# Patient Record
Sex: Female | Born: 1975 | Race: White | Hispanic: No | Marital: Single | Smoking: Former smoker
Health system: Southern US, Community
[De-identification: ages and names within clinical notes are randomized; demographics above are authoritative.]

## PROBLEM LIST (undated history)

## (undated) DIAGNOSIS — M5030 Other cervical disc degeneration, unspecified cervical region: Secondary | ICD-10-CM

## (undated) DIAGNOSIS — R928 Other abnormal and inconclusive findings on diagnostic imaging of breast: Secondary | ICD-10-CM

## (undated) DIAGNOSIS — K219 Gastro-esophageal reflux disease without esophagitis: Secondary | ICD-10-CM

## (undated) DIAGNOSIS — F419 Anxiety disorder, unspecified: Secondary | ICD-10-CM

## (undated) DIAGNOSIS — B2790 Infectious mononucleosis, unspecified without complication: Secondary | ICD-10-CM

## (undated) DIAGNOSIS — N760 Acute vaginitis: Secondary | ICD-10-CM

## (undated) DIAGNOSIS — R1900 Intra-abdominal and pelvic swelling, mass and lump, unspecified site: Secondary | ICD-10-CM

## (undated) DIAGNOSIS — T753XXA Motion sickness, initial encounter: Secondary | ICD-10-CM

## (undated) DIAGNOSIS — M069 Rheumatoid arthritis, unspecified: Secondary | ICD-10-CM

## (undated) DIAGNOSIS — N921 Excessive and frequent menstruation with irregular cycle: Secondary | ICD-10-CM

## (undated) DIAGNOSIS — B9689 Other specified bacterial agents as the cause of diseases classified elsewhere: Secondary | ICD-10-CM

## (undated) HISTORY — DX: Other specified bacterial agents as the cause of diseases classified elsewhere: B96.89

## (undated) HISTORY — PX: WISDOM TOOTH EXTRACTION: SHX21

## (undated) HISTORY — DX: Intra-abdominal and pelvic swelling, mass and lump, unspecified site: R19.00

## (undated) HISTORY — DX: Infectious mononucleosis, unspecified without complication: B27.90

## (undated) HISTORY — DX: Rheumatoid arthritis, unspecified: M06.9

## (undated) HISTORY — DX: Excessive and frequent menstruation with irregular cycle: N92.1

## (undated) HISTORY — DX: Other abnormal and inconclusive findings on diagnostic imaging of breast: R92.8

## (undated) HISTORY — DX: Other specified bacterial agents as the cause of diseases classified elsewhere: N76.0

---
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
## 2009-05-30 ENCOUNTER — Ambulatory Visit: Payer: Self-pay | Admitting: General Practice

## 2011-09-19 ENCOUNTER — Ambulatory Visit: Payer: Self-pay | Admitting: General Practice

## 2015-06-29 ENCOUNTER — Other Ambulatory Visit: Payer: Self-pay | Admitting: Emergency Medicine

## 2015-06-29 MED ORDER — FLUTICASONE PROPIONATE 50 MCG/ACT NA SUSP: 2.0000 | Freq: Every day | NASAL | Status: DC

## 2015-06-29 NOTE — Telephone Encounter (Signed)
Med refill

## 2015-06-29 NOTE — Telephone Encounter (Signed)
Received a faxed medication request from [facility].  Please advise.  Thank you.

## 2016-01-26 ENCOUNTER — Encounter: Payer: Self-pay | Admitting: Physician Assistant

## 2016-01-26 ENCOUNTER — Ambulatory Visit: Payer: Self-pay | Admitting: Physician Assistant

## 2016-01-26 VITALS — BP 110/65 | HR 69 | Temp 98.3°F

## 2016-01-26 DIAGNOSIS — T148XXA Other injury of unspecified body region, initial encounter: Secondary | ICD-10-CM

## 2016-01-26 MED ORDER — MUPIROCIN 2 % EX OINT: TOPICAL_OINTMENT | CUTANEOUS | 0 refills | Status: DC

## 2016-01-26 NOTE — Progress Notes (Signed)
S: c/o abrasion on top of hand, has some pink raised area around it, tdap utd, no fever/chills/drainage from site; sx for a week, just wants to get it checked  O: vitals wnl, nad, skin on r hand with small scab from abrasion, raised pink area at edges, no drainage, increased warmth, or tenderness, n/v intact  A: abrasion  P: bactroban ointment if redness spreading, if worsening call and will call in oral antibiotic

## 2016-03-25 ENCOUNTER — Other Ambulatory Visit: Payer: Self-pay | Admitting: Physician Assistant

## 2016-03-26 NOTE — Telephone Encounter (Signed)
Med refill approved 

## 2016-08-23 ENCOUNTER — Ambulatory Visit: Payer: Self-pay | Admitting: Physician Assistant

## 2016-08-23 ENCOUNTER — Encounter: Payer: Self-pay | Admitting: Physician Assistant

## 2016-08-23 VITALS — BP 100/60 | HR 71 | Temp 98.3°F

## 2016-08-23 DIAGNOSIS — J069 Acute upper respiratory infection, unspecified: Secondary | ICD-10-CM

## 2016-08-23 MED ORDER — FLUTICASONE PROPIONATE 50 MCG/ACT NA SUSP: 2.0000 | Freq: Every day | NASAL | 6 refills | Status: DC

## 2016-08-23 NOTE — Progress Notes (Signed)
S: C/o sore throat and cough for 3 days, no fever, chills, cp/sob, v/d; cough is dry, feels like its in her chest,   Using otc meds: ibuprofen  O: PE: vitals wnl, nad, perrl eomi, normocephalic, tms dull, nasal mucosa red and swollen, throat injected, neck supple no lymph, lungs c t a, cv rrr, neuro intact  A:  Acute viral uri   P: drink fluids, continue regular meds , use otc meds of choice, return if not improving in 5 days, return earlier if worsening , if worsening over the weekend will call in an antibiotic on Monday

## 2016-09-05 ENCOUNTER — Telehealth: Payer: Self-pay

## 2016-09-05 DIAGNOSIS — N6320 Unspecified lump in the left breast, unspecified quadrant: Secondary | ICD-10-CM

## 2016-09-05 NOTE — Telephone Encounter (Signed)
Pt calling for advice regarding mammogram letter.   Left msg for pt to call back (see mammo report in [place])

## 2016-09-05 NOTE — Telephone Encounter (Signed)
Patient returning Kasey's call regarding lab. [phone] until 4pm

## 2016-09-05 NOTE — Telephone Encounter (Signed)
Left msg for pt to call back

## 2016-09-06 ENCOUNTER — Encounter: Payer: Self-pay | Admitting: Physician Assistant

## 2016-09-06 ENCOUNTER — Ambulatory Visit: Payer: Self-pay | Admitting: Physician Assistant

## 2016-09-06 DIAGNOSIS — R1032 Left lower quadrant pain: Secondary | ICD-10-CM

## 2016-09-06 DIAGNOSIS — K6289 Other specified diseases of anus and rectum: Secondary | ICD-10-CM

## 2016-09-06 NOTE — Telephone Encounter (Signed)
Pt states she was told she needed to have a diagnostic mammogram and possibly ultrasound on left breast due to receiving letter regarding her dense breast tissue. (see [facility] - letter scanned from [place]) Pt would like to be advised if she really needs this additional imaging. Thank you.

## 2016-09-06 NOTE — Telephone Encounter (Signed)
Returning phone call °

## 2016-09-06 NOTE — Progress Notes (Signed)
S: c/o llq pain, states has had diarrhea for about a year but now some of the stools are formed, is now having pain in the rectal area, awakes her at night with a lot of pressure on the rectum, no bleeding, no trauma, [facility] doctor [facility] and had labs done, would like to see someone here in town bc the facility charges were too high at [facility], ?if she needs a colonoscopy, denies pelvic pain, no hx of ovarian cysts  O: vitals wnl, nad, abd soft nontender bs normal all 4 quads, lungs c t a, cv rrr  A: llq pain  P: refer to Dr Allen Norris for eval

## 2016-09-07 DIAGNOSIS — N6320 Unspecified lump in the left breast, unspecified quadrant: Secondary | ICD-10-CM | POA: Insufficient documentation

## 2016-09-07 NOTE — Addendum Note (Signed)
Addended by: Ardeth Perfect B on: 0/10/5911 09:29 PM   Modules accepted: Orders

## 2016-09-07 NOTE — Telephone Encounter (Signed)
Pt states she would rather go to [facility] in [place] rather than [place]. Please update order. Thank you

## 2016-09-07 NOTE — Telephone Encounter (Signed)
Yes ,they saw a small mass in the left breast which will need additional imaging. RN to notify pt. I will put order in computer and route to [facility] since in [place].

## 2016-09-07 NOTE — Telephone Encounter (Signed)
Diagnostic mammo and LT breast u/s orders amended for [facility]. Forward to [place].

## 2016-09-10 ENCOUNTER — Other Ambulatory Visit: Payer: Self-pay | Admitting: Obstetrics and Gynecology

## 2016-09-10 ENCOUNTER — Telehealth: Payer: Self-pay | Admitting: Obstetrics and Gynecology

## 2016-09-10 DIAGNOSIS — N6320 Unspecified lump in the left breast, unspecified quadrant: Secondary | ICD-10-CM

## 2016-09-10 NOTE — Telephone Encounter (Signed)
I spoke to the patient to let her know she needs to go to [facility] to sign a release for her prior imaging, that [facility] needs the images prior to scheduling an appointment, and that Roselyn Reef will contact her directly when the images are received. Patient understands and said she would try to go to [place] this week.

## 2016-09-10 NOTE — Telephone Encounter (Signed)
Per Roselyn Reef @ [facility], the patient needs to go to [facility] to sign a release for her prior imaging, and Roselyn Reef will then contact the patient directly to schedule an appointment. I spoke to the patient, who said she would try to go to [facility] this week.

## 2016-09-11 ENCOUNTER — Telehealth: Payer: Self-pay | Admitting: Gastroenterology

## 2016-09-11 NOTE — Telephone Encounter (Signed)
Spoke to patient to schedule appointment. She was driving and will call me back when she is at work to schedule for LLQ pain with GI

## 2016-09-30 DIAGNOSIS — R928 Other abnormal and inconclusive findings on diagnostic imaging of breast: Secondary | ICD-10-CM

## 2016-09-30 HISTORY — DX: Other abnormal and inconclusive findings on diagnostic imaging of breast: R92.8

## 2016-10-01 ENCOUNTER — Inpatient Hospital Stay
Admission: RE | Admit: 2016-10-01 | Discharge: 2016-10-01 | Disposition: A | Discharge status: Home / Self Care | Payer: Self-pay | Source: Ambulatory Visit | Attending: *Deleted | Admitting: *Deleted

## 2016-10-01 ENCOUNTER — Other Ambulatory Visit: Payer: Self-pay | Admitting: *Deleted

## 2016-10-01 DIAGNOSIS — Z9289 Personal history of other medical treatment: Secondary | ICD-10-CM

## 2016-10-05 ENCOUNTER — Other Ambulatory Visit: Payer: Self-pay | Admitting: Obstetrics and Gynecology

## 2016-10-05 DIAGNOSIS — N6320 Unspecified lump in the left breast, unspecified quadrant: Secondary | ICD-10-CM

## 2016-10-08 ENCOUNTER — Other Ambulatory Visit: Payer: Self-pay | Admitting: Obstetrics and Gynecology

## 2016-10-08 DIAGNOSIS — N63 Unspecified lump in unspecified breast: Secondary | ICD-10-CM

## 2016-10-08 DIAGNOSIS — R928 Other abnormal and inconclusive findings on diagnostic imaging of breast: Secondary | ICD-10-CM

## 2016-10-17 ENCOUNTER — Telehealth: Payer: Self-pay | Admitting: Gastroenterology

## 2016-10-17 ENCOUNTER — Encounter: Payer: Self-pay | Admitting: Gastroenterology

## 2016-10-17 ENCOUNTER — Other Ambulatory Visit: Payer: Self-pay

## 2016-10-17 ENCOUNTER — Ambulatory Visit (INDEPENDENT_AMBULATORY_CARE_PROVIDER_SITE_OTHER): Payer: Managed Care, Other (non HMO) | Admitting: Gastroenterology

## 2016-10-17 ENCOUNTER — Encounter (INDEPENDENT_AMBULATORY_CARE_PROVIDER_SITE_OTHER): Payer: Self-pay

## 2016-10-17 VITALS — BP 102/59 | HR 65 | Temp 98.3°F | Resp 16 | Wt 126.0 lb

## 2016-10-17 DIAGNOSIS — R197 Diarrhea, unspecified: Secondary | ICD-10-CM

## 2016-10-17 DIAGNOSIS — R198 Other specified symptoms and signs involving the digestive system and abdomen: Secondary | ICD-10-CM | POA: Diagnosis not present

## 2016-10-17 DIAGNOSIS — R194 Change in bowel habit: Secondary | ICD-10-CM | POA: Diagnosis not present

## 2016-10-17 NOTE — Telephone Encounter (Signed)
Left vm letting pt know I have moved her colonoscopy from 11/09/16 to 11/30/16 per her request.

## 2016-10-17 NOTE — Telephone Encounter (Signed)
Patient needs to r/s her colonoscopy from 11/09/16 and r/s to Fri 11/30/16, if possible.

## 2016-10-18 NOTE — Progress Notes (Signed)
Gastroenterology Consultation  Referring Provider:     No ref. provider found Primary Care Physician:  Ardeth Perfect, PA-C Primary Gastroenterologist:  Dr. Allen Norris     Reason for Consultation:     Rectal fullness        HPI:   Bethany Evans is a 41 y.o. y/o female referred for consultation & management of Rectal fullness by Dr. Elmo Putt Copland, PA-C.  This patient comes in today for a history of approximately 3 years of loose bowel movements.  The patient states that her bowel movements have become harder depending on what she eats.  The patient states her stools are harder when she eats a high protein diet.  The patient was seen in [facility] for the same issues and reports that she had blood work done at that time with being told everything was normal. Besides the change in bowel habits the patient also reports that she has episodes of rectal fullness.  She denies it to be pain but she feels like there is pressure on her rectum. The patient reports that this usually lasts approximately one half hour.  She denies any spasms or pain at this time.  She also reports that she goes to the bathroom to try to move her bowels with out usually having any results.  The patient denies any rectal bleeding and weight loss fever or chills.  The patient also denies any family history of colon cancer colon polyps.  There is no report of the diarrhea or her symptoms of rectal pressure being changed by her stress level, menstrual cycle or the food she has eaten.  She also reports that she try to avoid milk for one month without any change in her symptoms. The patient reports that her soft bowel movements are only once a day when she wakes up in the morning.  She also reports that sometimes she feels that The need to use the bathroom has woken her up in the early morning.  History reviewed. No pertinent past medical history.  History reviewed. No pertinent surgical history.  Prior to Admission medications   Medication  Sig Start Date End Date Taking? Authorizing Provider  fluticasone (FLONASE) 50 MCG/ACT nasal spray Place 2 sprays into both nostrils daily. 08/23/16  Yes Versie Starks, PA-C  loratadine (CLARITIN) 10 MG tablet TAKE 1 TABLET BY MOUTH DAILY 03/26/16  Yes Versie Starks, PA-C  LORazepam (ATIVAN) 0.5 MG tablet Take 0.5 mg by mouth.   Yes Historical Provider, MD  mupirocin ointment (BACTROBAN) 2 % Apply to wound bid until healed Patient not taking: Reported on 08/23/2016 01/26/16   Versie Starks, PA-C    History reviewed. No pertinent family history.   Social History  Substance Use Topics  . Smoking status: Former Research scientist (life sciences)  . Smokeless tobacco: Never Used  . Alcohol use Not on file    Allergies as of 10/17/2016 - Review Complete 10/17/2016  Allergen Reaction Noted  . Doxycycline Swelling 09/02/2014    Review of Systems:    All systems reviewed and negative except where noted in HPI.   Physical Exam:  BP (!) 102/59   Pulse 65   Temp 98.3 F (36.8 C)   Resp 16   Wt 126 lb (57.2 kg)   SpO2 100%  No LMP recorded. Psych:  Alert and cooperative. Normal mood and affect. General:   Alert,  Well-developed, well-nourished, pleasant and cooperative in NAD Head:  Normocephalic and atraumatic. Eyes:  Sclera clear, no icterus.  Conjunctiva pink. Ears:  Normal auditory acuity. Nose:  No deformity, discharge, or lesions. Mouth:  No deformity or lesions,oropharynx pink & moist. Neck:  Supple; no masses or thyromegaly. Lungs:  Respirations even and unlabored.  Clear throughout to auscultation.   No wheezes, crackles, or rhonchi. No acute distress. Heart:  Regular rate and rhythm; no murmurs, clicks, rubs, or gallops. Abdomen:  Normal bowel sounds.  No bruits.  Soft, non-tender and non-distended without masses, hepatosplenomegaly or hernias noted.  No guarding or rebound tenderness.  Negative Carnett sign.   Rectal:  Deferred.  Msk:  Symmetrical without gross deformities.  Good, equal movement &  strength bilaterally. Pulses:  Normal pulses noted. Extremities:  No clubbing or edema.  No cyanosis. Neurologic:  Alert and oriented x3;  grossly normal neurologically. Skin:  Intact without significant lesions or rashes.  No jaundice. Lymph Nodes:  No significant cervical adenopathy. Psych:  Alert and cooperative. Normal mood and affect.  Imaging Studies: Mm Outside Films Mammo  Result Date: 10/01/2016 This examination belongs to an outside facility and is stored here for comparison purposes only.  Contact the originating outside institution for any associated report or interpretation.   Assessment and Plan:   Bethany Evans is a 41 y.o. y/o female who comes in today with a history of loose bowel movements that typically happen only in the morning. The loose bowel movements have woken her up in the morning from her sleep.  The patient also has had episodes of rectal pressure.  The patient had a workup at [facility] who thought she may have irritable bowel syndrome.  The patient denies any external stressors or her menstrual cycle making the symptoms any worse.  The patient will be set up for a colonoscopy to rule out any colonic pathology as the cause of her loose bowel movements and episodes of rectal pressure. I have discussed risks & benefits which include, but are not limited to, bleeding, infection, perforation & drug reaction.  The patient agrees with this plan & written consent will be obtained.       Lucilla Lame, MD. Marval Regal   Note: This dictation was prepared with Dragon dictation along with smaller phrase technology. Any transcriptional errors that result from this process are unintentional.

## 2016-10-19 ENCOUNTER — Telehealth: Payer: Self-pay | Admitting: Gastroenterology

## 2016-10-19 NOTE — Telephone Encounter (Signed)
10/19/16 Automated system NO prior auth required for Colonoscopy 45378 / R19.7 Confirmation # [id number].

## 2016-10-23 ENCOUNTER — Other Ambulatory Visit: Payer: Self-pay | Admitting: Obstetrics and Gynecology

## 2016-10-23 ENCOUNTER — Ambulatory Visit
Admission: RE | Admit: 2016-10-23 | Discharge: 2016-10-23 | Disposition: A | Discharge status: Home / Self Care | Payer: Managed Care, Other (non HMO) | Source: Ambulatory Visit | Attending: Obstetrics and Gynecology | Admitting: Obstetrics and Gynecology

## 2016-10-23 DIAGNOSIS — N6320 Unspecified lump in the left breast, unspecified quadrant: Secondary | ICD-10-CM

## 2016-10-23 DIAGNOSIS — N63 Unspecified lump in unspecified breast: Secondary | ICD-10-CM

## 2016-10-23 DIAGNOSIS — R928 Other abnormal and inconclusive findings on diagnostic imaging of breast: Secondary | ICD-10-CM

## 2016-10-23 DIAGNOSIS — N6321 Unspecified lump in the left breast, upper outer quadrant: Secondary | ICD-10-CM | POA: Diagnosis present

## 2016-11-22 ENCOUNTER — Encounter: Payer: Self-pay | Admitting: *Deleted

## 2016-11-23 ENCOUNTER — Encounter: Payer: Self-pay | Admitting: Anesthesiology

## 2016-11-29 NOTE — Discharge Instructions (Signed)
General Anesthesia, Adult, Care After °These instructions provide you with information about caring for yourself after your procedure. Your health care provider may also give you more specific instructions. Your treatment has been planned according to current medical practices, but problems sometimes occur. Call your health care provider if you have any problems or questions after your procedure. °What can I expect after the procedure? °After the procedure, it is common to have: °· Vomiting. °· A sore throat. °· Mental slowness. ° °It is common to feel: °· Nauseous. °· Cold or shivery. °· Sleepy. °· Tired. °· Sore or achy, even in parts of your body where you did not have surgery. ° °Follow these instructions at home: °For at least 24 hours after the procedure: °· Do not: °? Participate in activities where you could fall or become injured. °? Drive. °? Use heavy machinery. °? Drink alcohol. °? Take sleeping pills or medicines that cause drowsiness. °? Make important decisions or sign legal documents. °? Take care of children on your own. °· Rest. °Eating and drinking °· If you vomit, drink water, juice, or soup when you can drink without vomiting. °· Drink enough fluid to keep your urine clear or pale yellow. °· Make sure you have little or no nausea before eating solid foods. °· Follow the diet recommended by your health care provider. °General instructions °· Have a responsible adult stay with you until you are awake and alert. °· Return to your normal activities as told by your health care provider. Ask your health care provider what activities are safe for you. °· Take over-the-counter and prescription medicines only as told by your health care provider. °· If you smoke, do not smoke without supervision. °· Keep all follow-up visits as told by your health care provider. This is important. °Contact a health care provider if: °· You continue to have nausea or vomiting at home, and medicines are not helpful. °· You  cannot drink fluids or start eating again. °· You cannot urinate after 8-12 hours. °· You develop a skin rash. °· You have fever. °· You have increasing redness at the site of your procedure. °Get help right away if: °· You have difficulty breathing. °· You have chest pain. °· You have unexpected bleeding. °· You feel that you are having a life-threatening or urgent problem. °This information is not intended to replace advice given to you by your health care provider. Make sure you discuss any questions you have with your health care provider. °Document Released: 09/24/2000 Document Revised: 11/21/2015 Document Reviewed: 06/02/2015 °Elsevier Interactive Patient Education © 2018 Elsevier Inc. ° °

## 2016-11-30 ENCOUNTER — Ambulatory Visit
Admission: RE | Admit: 2016-11-30 | Discharge: 2016-11-30 | Disposition: A | Discharge status: Home / Self Care | Payer: Managed Care, Other (non HMO) | Source: Ambulatory Visit | Attending: Gastroenterology | Admitting: Gastroenterology

## 2016-11-30 ENCOUNTER — Ambulatory Visit: Payer: Managed Care, Other (non HMO) | Admitting: Anesthesiology

## 2016-11-30 ENCOUNTER — Encounter
Admission: RE | Disposition: A | Discharge status: Home / Self Care | Payer: Self-pay | Source: Ambulatory Visit | Attending: Gastroenterology

## 2016-11-30 DIAGNOSIS — K640 First degree hemorrhoids: Secondary | ICD-10-CM | POA: Diagnosis not present

## 2016-11-30 DIAGNOSIS — Z881 Allergy status to other antibiotic agents status: Secondary | ICD-10-CM | POA: Diagnosis not present

## 2016-11-30 DIAGNOSIS — Z87891 Personal history of nicotine dependence: Secondary | ICD-10-CM | POA: Insufficient documentation

## 2016-11-30 DIAGNOSIS — R197 Diarrhea, unspecified: Secondary | ICD-10-CM | POA: Diagnosis not present

## 2016-11-30 HISTORY — DX: Motion sickness, initial encounter: T75.3XXA

## 2016-11-30 HISTORY — DX: Other cervical disc degeneration, unspecified cervical region: M50.30

## 2016-11-30 HISTORY — PX: COLONOSCOPY WITH PROPOFOL: SHX5780

## 2016-11-30 SURGERY — COLONOSCOPY WITH PROPOFOL
Anesthesia: General | Wound class: Contaminated

## 2016-11-30 MED ORDER — PROPOFOL 10 MG/ML IV BOLUS
INTRAVENOUS | Status: DC | PRN
  Administered 2016-11-30 (×4): 50 mg via INTRAVENOUS
  Administered 2016-11-30: 100 mg via INTRAVENOUS
  Administered 2016-11-30: 50 mg via INTRAVENOUS

## 2016-11-30 MED ORDER — ACETAMINOPHEN 325 MG PO TABS: 325.0000 mg | ORAL_TABLET | ORAL | Status: DC | PRN

## 2016-11-30 MED ORDER — LIDOCAINE HCL (CARDIAC) 20 MG/ML IV SOLN
INTRAVENOUS | Status: DC | PRN
  Administered 2016-11-30: 20 mg via INTRAVENOUS

## 2016-11-30 MED ORDER — STERILE WATER FOR IRRIGATION IR SOLN
Status: DC | PRN
  Administered 2016-11-30: 10:00:00

## 2016-11-30 MED ORDER — ACETAMINOPHEN 160 MG/5ML PO SOLN: 325.0000 mg | ORAL | Status: DC | PRN

## 2016-11-30 MED ORDER — LACTATED RINGERS IV SOLN
INTRAVENOUS | Status: DC
  Administered 2016-11-30: 09:00:00 via INTRAVENOUS

## 2016-11-30 SURGICAL SUPPLY — 23 items

## 2016-11-30 NOTE — Anesthesia Procedure Notes (Signed)
Procedure Name: MAC Date/Time: 11/30/2016 10:11 AM Performed by: Janna Arch Pre-anesthesia Checklist: Patient identified, Emergency Drugs available, Suction available and Patient being monitored Patient Re-evaluated:Patient Re-evaluated prior to inductionOxygen Delivery Method: Nasal cannula

## 2016-11-30 NOTE — Anesthesia Preprocedure Evaluation (Signed)
Anesthesia Evaluation  Patient identified by MRN, date of birth, ID band Patient awake    Reviewed: Allergy & Precautions, H&P , NPO status , Patient's Chart, lab work & pertinent test results  Airway Mallampati: II  TM Distance: >3 FB Neck ROM: full    Dental no notable dental hx.    Pulmonary former smoker,    Pulmonary exam normal        Cardiovascular Normal cardiovascular exam     Neuro/Psych    GI/Hepatic   Endo/Other    Renal/GU      Musculoskeletal   Abdominal   Peds  Hematology   Anesthesia Other Findings   Reproductive/Obstetrics                             Anesthesia Physical Anesthesia Plan  ASA: I  Anesthesia Plan: General   Post-op Pain Management:    Induction:   Airway Management Planned:   Additional Equipment:   Intra-op Plan:   Post-operative Plan:   Informed Consent: I have reviewed the patients History and Physical, chart, labs and discussed the procedure including the risks, benefits and alternatives for the proposed anesthesia with the patient or authorized representative who has indicated his/her understanding and acceptance.     Plan Discussed with:   Anesthesia Plan Comments:         Anesthesia Quick Evaluation

## 2016-11-30 NOTE — Anesthesia Postprocedure Evaluation (Signed)
Anesthesia Post Note  Patient: Bethany Evans  Procedure(s) Performed: Procedure(s) (LRB): COLONOSCOPY WITH PROPOFOL (N/A)  Patient location during evaluation: PACU Anesthesia Type: General Level of consciousness: awake and alert and oriented Pain management: satisfactory to patient Vital Signs Assessment: post-procedure vital signs reviewed and stable Respiratory status: spontaneous breathing, nonlabored ventilation and respiratory function stable Cardiovascular status: blood pressure returned to baseline and stable Postop Assessment: Adequate PO intake and No signs of nausea or vomiting Anesthetic complications: no    Raliegh Ip

## 2016-11-30 NOTE — Transfer of Care (Signed)
Immediate Anesthesia Transfer of Care Note  Patient: Bethany Evans  Procedure(s) Performed: Procedure(s): COLONOSCOPY WITH PROPOFOL (N/A)  Patient Location: PACU  Anesthesia Type: General  Level of Consciousness: awake, alert  and patient cooperative  Airway and Oxygen Therapy: Patient Spontanous Breathing and Patient connected to supplemental oxygen  Post-op Assessment: Post-op Vital signs reviewed, Patient's Cardiovascular Status Stable, Respiratory Function Stable, Patent Airway and No signs of Nausea or vomiting  Post-op Vital Signs: Reviewed and stable  Complications: No apparent anesthesia complications

## 2016-11-30 NOTE — Op Note (Signed)
[facility] Gastroenterology Patient Name: Bethany Evans Procedure Date: 11/30/2016 10:02 AM MRN: [id number] Account #: [id number] Date of Birth: [birth year] Admit Type: Outpatient Age: 41 Room: [facility] OR ROOM 01 Gender: Female Note Status: Finalized Procedure:            Colonoscopy Indications:          Clinically significant diarrhea of unexplained origin Providers:            Lucilla Lame MD, MD Referring MD:         Stoney Bang. Staebler (Referring MD) Medicines:            Propofol per Anesthesia Complications:        No immediate complications. Procedure:            Pre-Anesthesia Assessment:                       - Prior to the procedure, a History and Physical was                        performed, and patient medications and allergies were                        reviewed. The patient's tolerance of previous                        anesthesia was also reviewed. The risks and benefits of                        the procedure and the sedation options and risks were                        discussed with the patient. All questions were                        answered, and informed consent was obtained. Prior                        Anticoagulants: The patient has taken no previous                        anticoagulant or antiplatelet agents. ASA Grade                        Assessment: II - A patient with mild systemic disease.                        After reviewing the risks and benefits, the patient was                        deemed in satisfactory condition to undergo the                        procedure.                       After obtaining informed consent, the colonoscope was                        passed under direct vision. Throughout the procedure,  the patient's blood pressure, pulse, and oxygen                        saturations were monitored continuously. The [place] (S#: [id number]) was  introduced through                        the anus and advanced to the the terminal ileum. The                        colonoscopy was performed without difficulty. The                        patient tolerated the procedure well. The quality of                        the bowel preparation was excellent. Findings:      The perianal and digital rectal examinations were normal.      Non-bleeding internal hemorrhoids were found during retroflexion. The       hemorrhoids were Grade I (internal hemorrhoids that do not prolapse).      Random biopsies were obtained with cold forceps for histology randomly       in the entire colon. Impression:           - Non-bleeding internal hemorrhoids.                       - Random biopsies were obtained in the entire colon. Recommendation:       - Discharge patient to home.                       - Resume previous diet.                       - Continue present medications. Procedure Code(s):    --- Professional ---                       [phone], Colonoscopy, flexible; with biopsy, single or                        multiple Diagnosis Code(s):    --- Professional ---                       R19.7, Diarrhea, unspecified CPT copyright 2016 American Medical Association. All rights reserved. The codes documented in this report are preliminary and upon coder review may  be revised to meet current compliance requirements. Lucilla Lame MD, MD 11/30/2016 10:31:14 AM This report has been signed electronically. Number of Addenda: 0 Note Initiated On: 11/30/2016 10:02 AM Scope Withdrawal Time: 0 hours 5 minutes 47 seconds  Total Procedure Duration: 0 hours 12 minutes 21 seconds       [facility]

## 2016-11-30 NOTE — H&P (Signed)
   Lucilla Lame, MD [address] [place],  [address] Phone:[phone] Fax : [phone]  Primary Care Physician:  Amalia Greenhouse Primary Gastroenterologist:  Dr. Allen Norris  Pre-Procedure History & Physical: HPI:  Bethany Evans is a 41 y.o. female is here for an colonoscopy.   Past Medical History:  Diagnosis Date  . Degenerative disc disease, cervical    no issues currently  . Motion sickness    boats    Past Surgical History:  Procedure Laterality Date  . WISDOM TOOTH EXTRACTION     all 4 were impacted    Prior to Admission medications   Medication Sig Start Date End Date Taking? Authorizing Provider  fluticasone (FLONASE) 50 MCG/ACT nasal spray Place 2 sprays into both nostrils daily. 08/23/16  Yes Fisher, Linden Dolin, PA-C  LORazepam (ATIVAN) 0.5 MG tablet Take 0.5 mg by mouth.   Yes [provider]  loratadine (CLARITIN) 10 MG tablet TAKE 1 TABLET BY MOUTH DAILY Patient not taking: Reported on 11/22/2016 03/26/16   Versie Starks, PA-C  mupirocin ointment Drue Stager) 2 % Apply to wound bid until healed Patient not taking: Reported on 08/23/2016 01/26/16   Versie Starks, PA-C    Allergies as of 10/17/2016 - Review Complete 10/17/2016  Allergen Reaction Noted  . Doxycycline Swelling 09/02/2014    Family History  Problem Relation Age of Onset  . Breast cancer Neg Hx     Social History   Social History  . Marital status: Single    Spouse name: N/A  . Number of children: N/A  . Years of education: N/A   Occupational History  . Not on file.   Social History Main Topics  . Smoking status: Former Smoker    Quit date: 2015  . Smokeless tobacco: Never Used  . Alcohol use 1.2 oz/week    1 Glasses of wine, 1 Shots of liquor per week  . Drug use: Unknown  . Sexual activity: Not on file   Other Topics Concern  . Not on file   Social History Narrative  . No narrative on file    Review of Systems: See HPI, otherwise  negative ROS  Physical Exam: BP (!) 99/44   Pulse 65   Temp 97.5 F (36.4 C) (Tympanic)   Resp 14   Ht 5' 4.5" (1.638 m)   Wt 124 lb (56.2 kg)   LMP 11/10/2016 (Exact Date) Comment: Preg Test Negative  SpO2 100%   BMI 20.96 kg/m  General:   Alert,  pleasant and cooperative in NAD Head:  Normocephalic and atraumatic. Neck:  Supple; no masses or thyromegaly. Lungs:  Clear throughout to auscultation.    Heart:  Regular rate and rhythm. Abdomen:  Soft, nontender and nondistended. Normal bowel sounds, without guarding, and without rebound.   Neurologic:  Alert and  oriented x4;  grossly normal neurologically.  Impression/Plan: [name] is here for an colonoscopy to be performed for diarrhea  Risks, benefits, limitations, and alternatives regarding  colonoscopy have been reviewed with the patient.  Questions have been answered.  All parties agreeable.   Lucilla Lame, MD  11/30/2016, 9:29 AM

## 2016-12-03 ENCOUNTER — Encounter: Payer: Self-pay | Admitting: Gastroenterology

## 2016-12-05 ENCOUNTER — Encounter: Payer: Self-pay | Admitting: Gastroenterology

## 2016-12-06 ENCOUNTER — Encounter: Payer: Self-pay | Admitting: Gastroenterology

## 2016-12-09 ENCOUNTER — Encounter: Payer: Self-pay | Admitting: Obstetrics and Gynecology

## 2017-01-24 ENCOUNTER — Other Ambulatory Visit: Payer: Self-pay

## 2017-01-24 ENCOUNTER — Ambulatory Visit
Admission: RE | Admit: 2017-01-24 | Discharge: 2017-01-24 | Disposition: A | Discharge status: Home / Self Care | Payer: Managed Care, Other (non HMO) | Source: Ambulatory Visit | Attending: Obstetrics and Gynecology | Admitting: Obstetrics and Gynecology

## 2017-01-24 ENCOUNTER — Ambulatory Visit: Payer: Managed Care, Other (non HMO)

## 2017-01-24 DIAGNOSIS — N6321 Unspecified lump in the left breast, upper outer quadrant: Secondary | ICD-10-CM | POA: Diagnosis not present

## 2017-01-24 DIAGNOSIS — N6320 Unspecified lump in the left breast, unspecified quadrant: Secondary | ICD-10-CM | POA: Diagnosis present

## 2017-01-28 ENCOUNTER — Telehealth: Payer: Self-pay | Admitting: Obstetrics and Gynecology

## 2017-01-28 NOTE — Telephone Encounter (Signed)
LM for pt re: cat 3 f/u mammo and u/s LT breast, stable area. Radiology suggested possible bx. I recommend seeing gen surg for 2nd opinion first. Asked pt to call me back.

## 2017-03-04 ENCOUNTER — Other Ambulatory Visit: Payer: Self-pay | Admitting: Obstetrics and Gynecology

## 2017-03-05 ENCOUNTER — Other Ambulatory Visit: Payer: Self-pay

## 2017-03-08 ENCOUNTER — Other Ambulatory Visit: Payer: Self-pay | Admitting: Obstetrics and Gynecology

## 2017-03-08 NOTE — Telephone Encounter (Signed)
Please advise for refill. Thank you.  

## 2017-07-02 DIAGNOSIS — R1900 Intra-abdominal and pelvic swelling, mass and lump, unspecified site: Secondary | ICD-10-CM

## 2017-07-02 HISTORY — DX: Intra-abdominal and pelvic swelling, mass and lump, unspecified site: R19.00

## 2017-07-18 ENCOUNTER — Encounter: Payer: Self-pay | Admitting: Obstetrics and Gynecology

## 2017-07-18 ENCOUNTER — Ambulatory Visit (INDEPENDENT_AMBULATORY_CARE_PROVIDER_SITE_OTHER): Payer: Managed Care, Other (non HMO) | Admitting: Obstetrics and Gynecology

## 2017-07-18 ENCOUNTER — Other Ambulatory Visit: Payer: Self-pay

## 2017-07-18 VITALS — BP 100/60 | HR 84 | Ht 64.0 in | Wt 126.0 lb

## 2017-07-18 DIAGNOSIS — Z01419 Encounter for gynecological examination (general) (routine) without abnormal findings: Secondary | ICD-10-CM | POA: Diagnosis not present

## 2017-07-18 DIAGNOSIS — Z124 Encounter for screening for malignant neoplasm of cervix: Secondary | ICD-10-CM

## 2017-07-18 DIAGNOSIS — F419 Anxiety disorder, unspecified: Secondary | ICD-10-CM | POA: Diagnosis not present

## 2017-07-18 DIAGNOSIS — Z1151 Encounter for screening for human papillomavirus (HPV): Secondary | ICD-10-CM | POA: Diagnosis not present

## 2017-07-18 DIAGNOSIS — R1900 Intra-abdominal and pelvic swelling, mass and lump, unspecified site: Secondary | ICD-10-CM

## 2017-07-18 DIAGNOSIS — Z1239 Encounter for other screening for malignant neoplasm of breast: Secondary | ICD-10-CM

## 2017-07-18 DIAGNOSIS — Z1231 Encounter for screening mammogram for malignant neoplasm of breast: Secondary | ICD-10-CM | POA: Diagnosis not present

## 2017-07-18 DIAGNOSIS — R928 Other abnormal and inconclusive findings on diagnostic imaging of breast: Secondary | ICD-10-CM

## 2017-07-18 MED ORDER — LORAZEPAM 0.5 MG PO TABS: ORAL_TABLET | ORAL | 0 refills | Status: DC

## 2017-07-18 NOTE — Progress Notes (Signed)
PCP:  Chad Cordial, PA-C   Chief Complaint  Patient presents with  . Gynecologic Exam    No Complaints     HPI:      [name] is a 42 y.o. No obstetric history on file. who LMP was Patient's last menstrual period was 07/13/2017., presents today for her annual examination.  Her menses are regular every 28-30 days, lasting 7 days.  Dysmenorrhea mild, occurring first 1-2 days of flow. She does not have intermenstrual bleeding. Hx of 13 mm ant leio and 16 and 18 mm post leios on 2011 u/s.   Sex activity: has sex with females.  Last Pap: May 20, 2014  Results were: no abnormalities /neg HPV DNA  Hx of STDs: none  Last mammogram: 08/27/16 [place] Needed addl views LT breast; 10/23/16 cat 3, recommended 3 mo f/u; 01/24/17 mammo and u/s were stable; 6 mo f/u vs bx. Pt never did bx. She is concerned about yearly dx mammo this yr because dx mammos were so expensive last yr. There is no FH of breast cancer. There is no FH of ovarian cancer. The patient does not do self-breast exams.   Tobacco use: The patient denies current or previous tobacco use. Alcohol use: none No drug use.  Exercise: very active  She does get adequate calcium but  not Vitamin D in her diet.  She takes ativan sparingly prn anxiety and needs a RF.    Past Medical History:  Diagnosis Date  . Bacterial vaginosis   . Degenerative disc disease, cervical    no issues currently  . [place] Patient infection   . Menometrorrhagia   . Motion sickness    boats    Past Surgical History:  Procedure Laterality Date  . COLONOSCOPY WITH PROPOFOL N/A 11/30/2016   Procedure: COLONOSCOPY WITH PROPOFOL;  Surgeon: Lucilla Lame, MD;  Location: [place];  Service: Endoscopy;  Laterality: N/A; repeat 10 yrs  . HEAD & NECK SKIN LESION EXCISIONAL BIOPSY Left 1989   Face Benign  . WISDOM TOOTH EXTRACTION     all 4 were impacted    Family History  Problem Relation Age of Onset  . Hyperlipidemia Father    . Melanoma Maternal Grandmother   . Pancreatic cancer Maternal Grandmother 60  . Melanoma Mother 90  . Breast cancer Neg Hx     Social History   Socioeconomic History  . Marital status: Single    Spouse name: Not on file  . Number of children: Not on file  . Years of education: Not on file  . Highest education level: Not on file  Social Needs  . Financial resource strain: Not on file  . Food insecurity - worry: Not on file  . Food insecurity - inability: Not on file  . Transportation needs - medical: Not on file  . Transportation needs - non-medical: Not on file  Occupational History  . Not on file  Tobacco Use  . Smoking status: Former Smoker    Last attempt to quit: 2015    Years since quitting: 4.0  . Smokeless tobacco: Never Used  Substance and Sexual Activity  . Alcohol use: Yes    Alcohol/week: 1.2 oz    Types: 1 Glasses of wine, 1 Shots of liquor per week    Comment: Occasionally  . Drug use: No  . Sexual activity: Yes    Birth control/protection: None  Other Topics Concern  . Not on file  Social History Narrative  .  Not on file    Current Meds  Medication Sig  . loratadine (CLARITIN) 10 MG tablet TAKE 1 TABLET BY MOUTH DAILY  . LORazepam (ATIVAN) 0.5 MG tablet TAKE 1 TABLET BY MOUTH AS NEEDED SYMPTOMS  . [DISCONTINUED] LORazepam (ATIVAN) 0.5 MG tablet TAKE 1 TABLET BY MOUTH AS NEEDED SYMPTOMS     ROS:  Review of Systems  Constitutional: Negative for fatigue, fever and unexpected weight change.  Respiratory: Negative for cough, shortness of breath and wheezing.   Cardiovascular: Negative for chest pain, palpitations and leg swelling.  Gastrointestinal: Negative for blood in stool, constipation, diarrhea, nausea and vomiting.  Endocrine: Negative for cold intolerance, heat intolerance and polyuria.  Genitourinary: Negative for dyspareunia, dysuria, flank pain, frequency, genital sores, hematuria, menstrual problem, pelvic pain, urgency, vaginal  bleeding, vaginal discharge and vaginal pain.  Musculoskeletal: Negative for back pain, joint swelling and myalgias.  Skin: Negative for rash.  Neurological: Negative for dizziness, syncope, light-headedness, numbness and headaches.  Hematological: Negative for adenopathy.  Psychiatric/Behavioral: Positive for agitation. Negative for confusion, sleep disturbance and suicidal ideas. The patient is not nervous/anxious.      Objective: BP 100/60 (BP Location: Left Arm, Patient Position: Sitting, Cuff Size: Normal)   Pulse 84   Ht 5\' 4"  (1.626 m)   Wt 126 lb (57.2 kg)   LMP 07/13/2017   BMI 21.63 kg/m    Physical Exam  Constitutional: She is oriented to person, place, and time. She appears well-developed and well-nourished.  Genitourinary: Vagina normal. There is no rash or tenderness on the right labia. There is no rash or tenderness on the left labia. No erythema or tenderness in the vagina. No vaginal discharge found. Right adnexum does not display mass and does not display tenderness. Left adnexum does not display mass and does not display tenderness. Cervix does not exhibit motion tenderness or polyp.   Uterus is enlarged and exhibiting a mass. Uterus is not tender.  Neck: Normal range of motion. No thyromegaly present.  Cardiovascular: Normal rate, regular rhythm and normal heart sounds.  No murmur heard. Pulmonary/Chest: Effort normal and breath sounds normal. Right breast exhibits no mass, no nipple discharge, no skin change and no tenderness. Left breast exhibits no mass, no nipple discharge, no skin change and no tenderness.  Abdominal: Soft. She exhibits mass. There is no tenderness. There is no guarding.    ~4 CM FIRM PELVIC MASS ABOVE PUBIC BONE; SLIGHTLY UNCOMFORTABLE WITH PALPATION  Musculoskeletal: Normal range of motion.  Neurological: She is alert and oriented to person, place, and time. No cranial nerve deficit.  Psychiatric: She has a normal mood and affect. Her  behavior is normal.  Vitals reviewed.   Assessment/Plan: Encounter for annual routine gynecological examination  Cervical cancer screening - Plan: IGP, Aptima HPV  Screening for HPV (human papillomavirus) - Plan: IGP, Aptima HPV  Screening for breast cancer - Pt to sched mammo/u/s. - Plan: MM DIAG BREAST TOMO BILATERAL, US BREAST LTD UNI LEFT INC AXILLA  Abnormal mammogram - Plan: MM DIAG BREAST TOMO BILATERAL, US BREAST LTD UNI LEFT INC AXILLA  Pelvic mass - Felt on abd and pelvic exam. Question bladder, MSK, vs uterine. Check GYN u/s. Will f/u with results. Hx of small leio on 2011 u/s.  - Plan: US PELVIS TRANSVANGINAL NON-OB (TV ONLY)  Anxiety - Rx RF ativan. Pt takes very sparingly. - Plan: LORazepam (ATIVAN) 0.5 MG tablet  Meds ordered this encounter  Medications  . LORazepam (ATIVAN) 0.5 MG tablet  Sig: TAKE 1 TABLET BY MOUTH AS NEEDED SYMPTOMS    Dispense:  30 tablet    Refill:  0    This request is for a new prescription for a controlled substance as required by Federal/State law.             GYN counsel breast self exam, mammography screening, adequate intake of calcium and vitamin D, diet and exercise     F/U  Return in about 3 days (around 07/21/2017) for GYN u/s--[facility] to call pt after.  Alicia B. Copland, PA-C 07/18/2017 9:45 AM

## 2017-07-18 NOTE — Patient Instructions (Signed)
I value your feedback and entrusting us with your care. If you get a [facility] patient survey, I would appreciate you taking the time to let us know about your experience today. Thank you! 

## 2017-07-20 LAB — IGP, APTIMA HPV
HPV APTIMA: NEGATIVE
PAP Smear Comment: 0

## 2017-08-01 ENCOUNTER — Other Ambulatory Visit: Payer: Self-pay | Admitting: Obstetrics and Gynecology

## 2017-08-01 ENCOUNTER — Ambulatory Visit (INDEPENDENT_AMBULATORY_CARE_PROVIDER_SITE_OTHER): Payer: Managed Care, Other (non HMO)

## 2017-08-01 DIAGNOSIS — R1900 Intra-abdominal and pelvic swelling, mass and lump, unspecified site: Secondary | ICD-10-CM

## 2017-08-02 ENCOUNTER — Encounter: Payer: Self-pay | Admitting: Obstetrics and Gynecology

## 2017-08-05 ENCOUNTER — Telehealth: Payer: Self-pay | Admitting: Obstetrics and Gynecology

## 2017-08-05 ENCOUNTER — Encounter: Payer: Self-pay | Admitting: Obstetrics and Gynecology

## 2017-08-05 NOTE — Telephone Encounter (Signed)
Spoke with pt re: large and mult leio on u/s, palpable on abd and pelvic exams. Pt doesn't have menometrorrhagia/pelvic pain sx, but has had urinary frequency/urgency sx and GI issues for many months. Saw GI for rectal fullness, discomfort, bowel issues and had neg eval/colonoscopy. Pt wonders if urin and GI sx related to leio. Given size of them, could be pushing on both bladder and intestines.  Discussed hyst vs AUE (not sure if a candidate). Pt would like to think about it and will f/u with Dr. Kenton Kingfisher if desires to proceed with surg.

## 2017-08-26 ENCOUNTER — Other Ambulatory Visit: Payer: Self-pay

## 2017-08-27 ENCOUNTER — Encounter: Payer: Self-pay | Admitting: Obstetrics & Gynecology

## 2017-08-27 ENCOUNTER — Telehealth: Payer: Self-pay | Admitting: Obstetrics & Gynecology

## 2017-08-27 ENCOUNTER — Ambulatory Visit (INDEPENDENT_AMBULATORY_CARE_PROVIDER_SITE_OTHER): Payer: Managed Care, Other (non HMO) | Admitting: Obstetrics & Gynecology

## 2017-08-27 VITALS — BP 100/60 | HR 84 | Ht 64.0 in | Wt 127.0 lb

## 2017-08-27 DIAGNOSIS — D251 Intramural leiomyoma of uterus: Secondary | ICD-10-CM | POA: Diagnosis not present

## 2017-08-27 DIAGNOSIS — D250 Submucous leiomyoma of uterus: Secondary | ICD-10-CM | POA: Diagnosis not present

## 2017-08-27 NOTE — Progress Notes (Signed)
  HPI: Pt presents with pelvic pressure and urinary frequency/urgency; also feels she has GI side effects to fibroids (known for years, only recently worsening sx's). Her menses are regular every 28-30 days, lasting 7 days.  Dysmenorrhea mild, occurring first 1-2 days of flow. She does not have intermenstrual bleeding.  Recent Ultrasound demonstrates: Indications:pelvic mass Findings:  The uterus measures 14.33 x 9.61 x 11.04cm (transabdominally). Echo texture is heterogenous with evidence of focal masses. Within the uterus are multiple suspected fibroids measuring: Fibroid 1: 3.22 x 4.03 cm; Left posterior fundal, intramural Fibroid 2: 7.45 x 8.85 cm; Posterior fundal; intramural Fibroid 3: .79 x 1.27 cm; Right lateral fundal; intramural Fibroid 4: 1.69 x 1.95 cm; Rt lateral fundal, intramural Fibroid 5: 1.86 x 1.11 cm; Posterior LUS, subserosal The Endometrium measures 4.37 mm. Right Ovary is not seen due to enlarged fibroid uterus Left Ovary measures 2.44 x 2.28 x 1.17 cm. It is normal appearance. (seen transabdominally) Survey of the adnexa demonstrates no adnexal masses. There is no free fluid in the cul de sac.  PMHx: She  has a past medical history of Abnormal mammogram of left breast (09/2016), Bacterial vaginosis, Degenerative disc disease, cervical, Epstein Barr infection, Menometrorrhagia, Motion sickness, and Pelvic mass (07/2017). Also,  has a past surgical history that includes Wisdom tooth extraction; Colonoscopy with propofol (N/A, 11/30/2016); and Head & neck skin lesion excisional biopsy (Left, 1989)., family history includes Hyperlipidemia in her father; Melanoma in her maternal grandmother; Melanoma (age of onset: 11) in her mother; Pancreatic cancer (age of onset: 37) in her maternal grandmother.,  reports that she quit smoking about 4 years ago. she has never used smokeless tobacco. She reports that she drinks about 1.2 oz of alcohol per week. She reports that she does not  use drugs.  She has a current medication list which includes the following prescription(s): fluticasone, loratadine, lorazepam, and mupirocin ointment. Also, is allergic to doxycycline.  Review of Systems  All other systems reviewed and are negative.  Objective: BP 100/60   Pulse 84   Ht 5\' 4"  (1.626 m)   Wt 127 lb (57.6 kg)   LMP 07/29/2017   BMI 21.80 kg/m   Physical examination Constitutional NAD, Conversant  Skin No rashes, lesions or ulceration.   Extremities: Moves all appropriately.  Normal ROM for age. No lymphadenopathy.  Neuro: Grossly intact  Psych: Oriented to PPT.  Normal mood. Normal affect.   Assessment:  Intramural and submucous leiomyoma of uterus Options discussed, desires TLH BS.  Myomectomy, [place], Lupron, exp mgt also disucssed.  Info provided to pt.  Barnett Applebaum, MD, [facility], [facility] 08/27/2017  8:26 AM

## 2017-08-27 NOTE — Telephone Encounter (Signed)
Lmtrc

## 2017-08-27 NOTE — Telephone Encounter (Signed)
Surgery Booking Request Patient Full Name:  Bethany Evans  MRN: [id number]  DOB: 02-03-1976  Surgeon: Hoyt Koch, MD  Requested Surgery Date and Time: 09/26/17 Primary Diagnosis AND Code: Fibroid Uterus, Pelvic Pressure, Urinary frequency and urgency Secondary Diagnosis and Code:  Surgical Procedure: TLH, BS, Cysto, possible laparotomy L&D Notification: No Admission Status: same day surgery Length of Surgery: 1.5 hrs Special Case Needs: possible laparotomy, possible morcellator H&P:  yes (date) Phone Interview???: yes Interpreter: Language:  Medical Clearance: no Special Scheduling Instructions: no

## 2017-08-27 NOTE — Patient Instructions (Signed)
Total Laparoscopic Hysterectomy °A total laparoscopic hysterectomy is a minimally invasive surgery to remove your uterus and cervix. This surgery is performed by making several small cuts (incisions) in your abdomen. It can also be done with a thin, lighted tube (laparoscope) inserted into two small incisions in your lower abdomen. Your fallopian tubes and ovaries can be removed (bilateral salpingo-oophorectomy) during this surgery as well. Benefits of minimally invasive surgery include: °· Less pain. °· Less risk of blood loss. °· Less risk of infection. °· Quicker return to normal activities. ° °Tell a health care provider about: °· Any allergies you have. °· All medicines you are taking, including vitamins, herbs, eye drops, creams, and over-the-counter medicines. °· Any problems you or family members have had with anesthetic medicines. °· Any blood disorders you have. °· Any surgeries you have had. °· Any medical conditions you have. °What are the risks? °Generally, this is a safe procedure. However, as with any procedure, complications can occur. Possible complications include: °· Bleeding. °· Blood clots in the legs or lung. °· Infection. °· Injury to surrounding organs. °· Problems with anesthesia. °· Early menopause symptoms (hot flashes, night sweats, insomnia). °· Risk of conversion to an open abdominal incision. ° °What happens before the procedure? °· Ask your health care provider about changing or stopping your regular medicines. °· Do not take aspirin or blood thinners (anticoagulants) for 1 week before the surgery or as told by your health care provider. °· Do not eat or drink anything for 8 hours before the surgery or as told by your health care provider. °· Quit smoking if you smoke. °· Arrange for a ride home after surgery and for someone to help you at home during recovery. °What happens during the procedure? °· You will be given antibiotic medicine. °· An IV tube will be placed in your arm. You  will be given medicine to make you sleep (general anesthetic). °· A gas (carbon dioxide) will be used to inflate your abdomen. This will allow your surgeon to look inside your abdomen, perform your surgery, and treat any other problems found if necessary. °· Three or four small incisions (often less than 1/2 inch) will be made in your abdomen. One of these incisions will be made in the area of your belly button (navel). The laparoscope will be inserted into the incision. Your surgeon will look through the laparoscope while doing your procedure. °· Other surgical instruments will be inserted through the other incisions. °· Your uterus may be removed through your vagina or cut into small pieces and removed through the small incisions. °· Your incisions will be closed. °What happens after the procedure? °· The gas will be released from inside your abdomen. °· You will be taken to the recovery area where a nurse will watch and check your progress. Once you are awake, stable, and taking fluids well, without other problems, you will return to your room or be allowed to go home. °· There is usually minimal discomfort following the surgery because the incisions are so small. °· You will be given pain medicine while you are in the hospital and for when you go home. °This information is not intended to replace advice given to you by your health care provider. Make sure you discuss any questions you have with your health care provider. °Document Released: 04/15/2007 Document Revised: 11/24/2015 Document Reviewed: 01/06/2013 °Elsevier Interactive Patient Education © 2017 [employer] Inc[employer] ° °

## 2017-08-28 NOTE — Telephone Encounter (Signed)
Lmtrc

## 2017-08-28 NOTE — Telephone Encounter (Signed)
Patient returned the call and is aware of H&p at [facility] on 09/19/17 @ 8:20am, Pre-admit Testing phone interview to be scheduled, and OR on 09/26/17. Patient is aware to turn in fmla paperwork as soon as she is able.Patient is aware to expect phone calls from the [facility] and [facility].

## 2017-09-16 ENCOUNTER — Telehealth: Payer: Self-pay

## 2017-09-16 NOTE — Telephone Encounter (Signed)
FMLA/DISABILITY form for [facility] filled out, signature obtained and given to [place] for procesing.

## 2017-09-19 ENCOUNTER — Ambulatory Visit (INDEPENDENT_AMBULATORY_CARE_PROVIDER_SITE_OTHER): Payer: Managed Care, Other (non HMO) | Admitting: Obstetrics & Gynecology

## 2017-09-19 ENCOUNTER — Other Ambulatory Visit: Payer: Self-pay

## 2017-09-19 ENCOUNTER — Encounter
Admission: RE | Admit: 2017-09-19 | Discharge: 2017-09-19 | Disposition: A | Discharge status: Home / Self Care | Payer: Managed Care, Other (non HMO) | Source: Ambulatory Visit | Attending: Obstetrics & Gynecology | Admitting: Obstetrics & Gynecology

## 2017-09-19 ENCOUNTER — Encounter: Payer: Self-pay | Admitting: *Deleted

## 2017-09-19 ENCOUNTER — Encounter: Payer: Self-pay | Admitting: Obstetrics & Gynecology

## 2017-09-19 VITALS — BP 110/60 | HR 89 | Ht 64.0 in | Wt 125.0 lb

## 2017-09-19 DIAGNOSIS — R350 Frequency of micturition: Secondary | ICD-10-CM

## 2017-09-19 DIAGNOSIS — R102 Pelvic and perineal pain: Secondary | ICD-10-CM

## 2017-09-19 DIAGNOSIS — D251 Intramural leiomyoma of uterus: Secondary | ICD-10-CM

## 2017-09-19 DIAGNOSIS — D250 Submucous leiomyoma of uterus: Secondary | ICD-10-CM | POA: Diagnosis not present

## 2017-09-19 NOTE — Progress Notes (Signed)
PRE-OPERATIVE HISTORY AND PHYSICAL EXAM  HPI:  Bethany Evans is a 42 y.o. G1P1001 No LMP recorded.; she is being admitted for surgery related to fibroids.  Pt presents with pelvic pressure and urinary frequency/urgency; also feels she has GI side effects to fibroids (known for years, only recently worsening sx's). Her menses are regular every 28-30 days, lasting7days. Dysmenorrhea mild, occurringfirst 1-2 days of flow. [place] nothave intermenstrual bleeding.  Recent Ultrasound demonstrates: Indications:pelvic mass Findings: The uterus measures 14.33 x 9.61 x 11.04cm (transabdominally). Echo texture is heterogenouswithevidence of focal masses. Within the uterus are multiple suspected fibroids measuring: Fibroid 1: 3.22 x 4.03 cm; Left posterior fundal, intramural Fibroid 2: 7.45 x 8.85 cm; Posterior fundal; intramural Fibroid 3: .79 x 1.27 cm; Right lateral fundal; intramural Fibroid 4: 1.69 x 1.95 cm; Rt lateral fundal, intramural Fibroid 5: 1.86 x 1.11 cm; Posterior LUS, subserosal  PMHx: Past Medical History:  Diagnosis Date  . Abnormal mammogram of left breast 09/2016  . Bacterial vaginosis   . Degenerative disc disease, cervical    no issues currently  . [place] Patient infection   . Menometrorrhagia   . Motion sickness    boats  . Pelvic mass 07/2017   Past Surgical History:  Procedure Laterality Date  . COLONOSCOPY WITH PROPOFOL N/A 11/30/2016   Procedure: COLONOSCOPY WITH PROPOFOL;  Surgeon: Lucilla Lame, MD;  Location: [place];  Service: Endoscopy;  Laterality: N/A; repeat 10 yrs  . HEAD & NECK SKIN LESION EXCISIONAL BIOPSY Left 1989   Face Benign  . WISDOM TOOTH EXTRACTION     all 4 were impacted   Family History  Problem Relation Age of Onset  . Hyperlipidemia Father   . Melanoma Maternal Grandmother   . Pancreatic cancer Maternal Grandmother 60  . Melanoma Mother 70  . Breast cancer Neg Hx    Social History   Tobacco Use  . Smoking  status: Former Smoker    Last attempt to quit: 2015    Years since quitting: 4.2  . Smokeless tobacco: Never Used  Substance Use Topics  . Alcohol use: Yes    Alcohol/week: 1.2 oz    Types: 1 Glasses of wine, 1 Shots of liquor per week    Comment: Occasionally  . Drug use: No    Current Outpatient Medications:  .  calcium carbonate (TUMS - DOSED IN MG ELEMENTAL CALCIUM) 500 MG chewable tablet, Chew 1 tablet by mouth daily as needed for indigestion or heartburn., Disp: , Rfl:  .  ibuprofen (ADVIL,MOTRIN) 200 MG tablet, Take 600 mg by mouth daily as needed for moderate pain., Disp: , Rfl:  .  loratadine (CLARITIN) 10 MG tablet, TAKE 1 TABLET BY MOUTH DAILY (Patient taking differently: Take 10 mgs by mouth once daily as needed for allergies), Disp: 30 tablet, Rfl: 12 .  LORazepam (ATIVAN) 0.5 MG tablet, TAKE 1 TABLET BY MOUTH AS NEEDED SYMPTOMS (Patient taking differently: Take 0.5 mg by mouth daily as needed for anxiety. ), Disp: 30 tablet, Rfl: 0 Allergies: Doxycycline  Review of Systems  Constitutional: Negative for chills, fever and malaise/fatigue.  HENT: Negative for congestion, sinus pain and sore throat.   Eyes: Negative for blurred vision and pain.  Respiratory: Negative for cough and wheezing.   Cardiovascular: Negative for chest pain and leg swelling.  Gastrointestinal: Negative for abdominal pain, constipation, diarrhea, heartburn, nausea and vomiting.  Genitourinary: Negative for dysuria, frequency, hematuria and urgency.  Musculoskeletal: Negative for back pain, joint pain, myalgias  and neck pain.  Skin: Negative for itching and rash.  Neurological: Negative for dizziness, tremors and weakness.  Endo/Heme/Allergies: Does not bruise/bleed easily.  Psychiatric/Behavioral: Negative for depression. The patient is not nervous/anxious and does not have insomnia.     Objective: There were no vitals taken for this visit. There were no vitals filed for this visit. Physical Exam    Constitutional: She is oriented to person, place, and time. She appears well-developed and well-nourished. No distress.  Genitourinary: Rectum normal and vagina normal. Pelvic exam was performed with patient supine. There is no rash or lesion on the right labia. There is no rash or lesion on the left labia. Vagina exhibits no lesion. No bleeding in the vagina. Right adnexum does not display mass and does not display tenderness. Left adnexum does not display mass and does not display tenderness. Cervix does not exhibit motion tenderness, lesion, friability or polyp.   Uterus is enlarged, exhibiting a mass, mobile and midaxial.  Genitourinary Comments: Multiglobular fibroid uterus  HENT:  Head: Normocephalic and atraumatic. Head is without laceration.  Right Ear: Hearing normal.  Left Ear: Hearing normal.  Nose: No epistaxis.  No foreign bodies.  Mouth/Throat: Uvula is midline, oropharynx is clear and moist and mucous membranes are normal.  Eyes: Pupils are equal, round, and reactive to light.  Neck: Normal range of motion. Neck supple. No thyromegaly present.  Cardiovascular: Normal rate and regular rhythm. Exam reveals no gallop and no friction rub.  No murmur heard. Pulmonary/Chest: Effort normal and breath sounds normal. No respiratory distress. She has no wheezes. Right breast exhibits no mass, no skin change and no tenderness. Left breast exhibits no mass, no skin change and no tenderness.  Abdominal: Soft. Bowel sounds are normal. She exhibits mass. She exhibits no distension. There is no tenderness. There is no rebound.  Palpable uterus fundus just above pubic symphysis  Musculoskeletal: Normal range of motion.  Neurological: She is alert and oriented to person, place, and time. No cranial nerve deficit.  Skin: Skin is warm and dry.  Psychiatric: She has a normal mood and affect. Judgment normal.  Vitals reviewed.   Assessment: 1. Intramural and submucous leiomyoma of uterus   2.  Pelvic pain in female    Options for fibroids and their management discussed.  Plan TLH, BS, Cysto, possible laparotomy.  I have had a careful discussion with this patient about all the options available and the risk/benefits of each. I have fully informed this patient that surgery may subject her to a variety of discomforts and risks: She understands that most patients have surgery with little difficulty, but problems can happen ranging from minor to fatal. These include nausea, vomiting, pain, bleeding, infection, poor healing, hernia, or formation of adhesions. Unexpected reactions may occur from any drug or anesthetic given. Unintended injury may occur to other pelvic or abdominal structures such as Fallopian tubes, ovaries, bladder, ureter (tube from kidney to bladder), or bowel. Nerves going from the pelvis to the legs may be injured. Any such injury may require immediate or later additional surgery to correct the problem. Excessive blood loss requiring transfusion is very unlikely but possible. Dangerous blood clots may form in the legs or lungs. Physical and sexual activity will be restricted in varying degrees for an indeterminate period of time but most often 2-6 weeks.  Finally, she understands that it is impossible to list every possible undesirable effect and that the condition for which surgery is done is not always cured or  significantly improved, and in rare cases may be even worse.Ample time was given to answer all questions.  Barnett Applebaum, MD, [facility], [facility] 09/19/2017  7:57 AM

## 2017-09-19 NOTE — Patient Instructions (Signed)
Your procedure is scheduled on: 09-26-17 THURSDAY Report to Same Day Surgery 2nd floor medical mall [facility] Entrance-take elevator on left to 2nd floor.  Check in with surgery information desk.) To find out your arrival time please call [phone] between 1PM - 3PM on 09-25-17 [facility]  Remember: Instructions that are not followed completely may result in serious medical risk, up to and including death, or upon the discretion of your surgeon and anesthesiologist your surgery may need to be rescheduled.    _x___ 1. Do not eat food after midnight the night before your procedure. NO GUM OR CANDY AFTER MIDNIGHT.  You may drink clear liquids up to 2 hours before you are scheduled to arrive at the hospital for your procedure.  Do not drink clear liquids within 2 hours of your scheduled arrival to the hospital.  Clear liquids include  --Water or Apple juice without pulp  --Clear carbohydrate beverage such as ClearFast or Gatorade  --Black Coffee or Clear Tea (No milk, no creamers, do not add anything to the coffee or Tea    __x__ 2. No Alcohol for 24 hours before or after surgery.   __x__3. No Smoking or e-cigarettes for 24 prior to surgery.  Do not use any chewable tobacco products for at least 6 hour prior to surgery   ____  4. Bring all medications with you on the day of surgery if instructed.    __x__ 5. Notify your doctor if there is any change in your medical condition     (cold, fever, infections).    x___6. On the morning of surgery brush your teeth with toothpaste and water.  You may rinse your mouth with mouth wash if you wish.  Do not swallow any toothpaste or mouthwash.   Do not wear jewelry, make-up, hairpins, clips or nail polish.  Do not wear lotions, powders, or perfumes. You may wear deodorant.  Do not shave 48 hours prior to surgery. Men may shave face and neck.  Do not bring valuables to the hospital.    [facility] is not responsible for any belongings or  valuables.               Contacts, dentures or bridgework may not be worn into surgery.  Leave your suitcase in the car. After surgery it may be brought to your room.  For patients admitted to the hospital, discharge time is determined by your treatment team.  _  Patients discharged the day of surgery will not be allowed to drive home.  You will need someone to drive you home and stay with you the night of your procedure.    Please read over the following fact sheets that you were given:   [facility] Preparing for Surgery and or MRSA Information   _x___ TAKE THE FOLLOWING MEDICATION THE MORNING OF SURGERY WITH  A SMALL SIP OF WATER. These include:  1. YOU MAY TAKE YOUR ATIVAN AM OF SURGERY IF NEEDED WITH A SMALL SIP OF WATER  2.  3.  4.  5.  6.  ____Fleets enema or Magnesium Citrate as directed.   _x___ Use CHG Soap or sage wipes as directed on instruction sheet   ____ Use inhalers on the day of surgery and bring to hospital day of surgery  ____ Stop Metformin and Janumet 2 days prior to surgery.    ____ Take 1/2 of usual insulin dose the night before surgery and none on the morning surgery.   ____ Follow recommendations  from Cardiologist, Pulmonologist or PCP regarding stopping Aspirin, Coumadin, Plavix ,Eliquis, Effient, or Pradaxa, and Pletal.  X____Stop Anti-inflammatories such as Advil, Aleve, IBUPROFEN, Motrin, Naproxen, Naprosyn, Goodies powders or aspirin products NOW-OK to take Tylenol    ____ Stop supplements until after surgery.    ____ Bring C-Pap to the hospital.

## 2017-09-19 NOTE — Patient Instructions (Signed)

## 2017-09-20 ENCOUNTER — Telehealth: Payer: Self-pay | Admitting: Obstetrics & Gynecology

## 2017-09-20 ENCOUNTER — Encounter
Admission: RE | Admit: 2017-09-20 | Discharge: 2017-09-20 | Disposition: A | Discharge status: Home / Self Care | Payer: Managed Care, Other (non HMO) | Source: Ambulatory Visit | Attending: Obstetrics & Gynecology | Admitting: Obstetrics & Gynecology

## 2017-09-20 DIAGNOSIS — Z01812 Encounter for preprocedural laboratory examination: Secondary | ICD-10-CM | POA: Diagnosis not present

## 2017-09-20 LAB — CBC
HCT: 43.7 % (ref 35.0–47.0)
Hemoglobin: 15.1 g/dL (ref 12.0–16.0)
MCH: 31.2 pg (ref 26.0–34.0)
MCHC: 34.6 g/dL (ref 32.0–36.0)
MCV: 90.1 fL (ref 80.0–100.0)
Platelets: 241 10*3/uL (ref 150–440)
RBC: 4.85 MIL/uL (ref 3.80–5.20)
RDW: 13.6 % (ref 11.5–14.5)
WBC: 9.4 10*3/uL (ref 3.6–11.0)

## 2017-09-20 LAB — TYPE AND SCREEN
ABO/RH(D): A POS
Antibody Screen: NEGATIVE

## 2017-09-20 NOTE — Telephone Encounter (Signed)
Spoke with patient to let her know that her FMLA paperwork has been faxed through to [facility]

## 2017-09-25 MED ORDER — CEFOXITIN SODIUM-DEXTROSE 2-2.2 GM-%(50ML) IV SOLR: 2.0000 g | INTRAVENOUS | Status: AC

## 2017-09-26 ENCOUNTER — Other Ambulatory Visit: Payer: Self-pay

## 2017-09-26 ENCOUNTER — Ambulatory Visit: Payer: Managed Care, Other (non HMO) | Admitting: Certified Registered"

## 2017-09-26 ENCOUNTER — Ambulatory Visit
Admission: RE | Admit: 2017-09-26 | Discharge: 2017-09-26 | Disposition: A | Discharge status: Home / Self Care | Payer: Managed Care, Other (non HMO) | Source: Ambulatory Visit | Attending: Obstetrics & Gynecology | Admitting: Obstetrics & Gynecology

## 2017-09-26 ENCOUNTER — Encounter
Admission: RE | Disposition: A | Discharge status: Home / Self Care | Payer: Self-pay | Source: Ambulatory Visit | Attending: Obstetrics & Gynecology

## 2017-09-26 DIAGNOSIS — K219 Gastro-esophageal reflux disease without esophagitis: Secondary | ICD-10-CM | POA: Insufficient documentation

## 2017-09-26 DIAGNOSIS — N72 Inflammatory disease of cervix uteri: Secondary | ICD-10-CM | POA: Insufficient documentation

## 2017-09-26 DIAGNOSIS — N800 Endometriosis of uterus: Secondary | ICD-10-CM | POA: Insufficient documentation

## 2017-09-26 DIAGNOSIS — Z87891 Personal history of nicotine dependence: Secondary | ICD-10-CM | POA: Insufficient documentation

## 2017-09-26 DIAGNOSIS — Z79899 Other long term (current) drug therapy: Secondary | ICD-10-CM | POA: Insufficient documentation

## 2017-09-26 DIAGNOSIS — R350 Frequency of micturition: Secondary | ICD-10-CM | POA: Insufficient documentation

## 2017-09-26 DIAGNOSIS — R3915 Urgency of urination: Secondary | ICD-10-CM | POA: Insufficient documentation

## 2017-09-26 DIAGNOSIS — D259 Leiomyoma of uterus, unspecified: Secondary | ICD-10-CM | POA: Insufficient documentation

## 2017-09-26 HISTORY — DX: Anxiety disorder, unspecified: F41.9

## 2017-09-26 HISTORY — PX: LAPAROSCOPIC HYSTERECTOMY: SHX1926

## 2017-09-26 HISTORY — PX: CYSTOSCOPY: SHX5120

## 2017-09-26 HISTORY — DX: Gastro-esophageal reflux disease without esophagitis: K21.9

## 2017-09-26 LAB — POCT PREGNANCY, URINE: PREG TEST UR: NEGATIVE

## 2017-09-26 SURGERY — HYSTERECTOMY, TOTAL, LAPAROSCOPIC
Anesthesia: General | Site: Bladder | Wound class: Clean Contaminated

## 2017-09-26 MED ORDER — ONDANSETRON HCL 4 MG/2ML IJ SOLN
INTRAMUSCULAR | Status: AC
  Filled 2017-09-26: qty 2

## 2017-09-26 MED ORDER — SUGAMMADEX SODIUM 200 MG/2ML IV SOLN
INTRAVENOUS | Status: DC | PRN
  Administered 2017-09-26: 200 mg via INTRAVENOUS

## 2017-09-26 MED ORDER — BUPIVACAINE HCL (PF) 0.5 % IJ SOLN
INTRAMUSCULAR | Status: AC
  Filled 2017-09-26: qty 30

## 2017-09-26 MED ORDER — LACTATED RINGERS IV SOLN
INTRAVENOUS | Status: DC
  Administered 2017-09-26 (×2): via INTRAVENOUS

## 2017-09-26 MED ORDER — SUGAMMADEX SODIUM 200 MG/2ML IV SOLN
INTRAVENOUS | Status: AC
  Filled 2017-09-26: qty 2

## 2017-09-26 MED ORDER — SEVOFLURANE IN SOLN
RESPIRATORY_TRACT | Status: AC
  Filled 2017-09-26: qty 250

## 2017-09-26 MED ORDER — ACETAMINOPHEN 650 MG RE SUPP
650.0000 mg | RECTAL | Status: DC | PRN
  Filled 2017-09-26: qty 1

## 2017-09-26 MED ORDER — ACETAMINOPHEN 10 MG/ML IV SOLN
INTRAVENOUS | Status: DC | PRN
  Administered 2017-09-26: 1000 mg via INTRAVENOUS

## 2017-09-26 MED ORDER — FENTANYL CITRATE (PF) 100 MCG/2ML IJ SOLN
25.0000 ug | INTRAMUSCULAR | Status: DC | PRN
  Administered 2017-09-26 (×2): 50 ug via INTRAVENOUS

## 2017-09-26 MED ORDER — GLYCOPYRROLATE 0.2 MG/ML IJ SOLN
INTRAMUSCULAR | Status: AC
  Filled 2017-09-26: qty 1

## 2017-09-26 MED ORDER — LORAZEPAM 2 MG/ML IJ SOLN
0.5000 mg | Freq: Once | INTRAMUSCULAR | Status: AC
  Administered 2017-09-26: 0.5 mg via INTRAVENOUS

## 2017-09-26 MED ORDER — FAMOTIDINE 20 MG PO TABS
ORAL_TABLET | ORAL | Status: AC
  Administered 2017-09-26: 20 mg via ORAL
  Filled 2017-09-26: qty 1

## 2017-09-26 MED ORDER — ROCURONIUM BROMIDE 100 MG/10ML IV SOLN
INTRAVENOUS | Status: DC | PRN
  Administered 2017-09-26: 50 mg via INTRAVENOUS

## 2017-09-26 MED ORDER — CEFOXITIN SODIUM 2 G IV SOLR
INTRAVENOUS | Status: DC | PRN
  Administered 2017-09-26: 2 g via INTRAVENOUS

## 2017-09-26 MED ORDER — KETOROLAC TROMETHAMINE 30 MG/ML IJ SOLN: 30.0000 mg | Freq: Four times a day (QID) | INTRAMUSCULAR | Status: DC

## 2017-09-26 MED ORDER — HYDROMORPHONE HCL 1 MG/ML IJ SOLN
INTRAMUSCULAR | Status: AC
  Filled 2017-09-26: qty 2

## 2017-09-26 MED ORDER — FENTANYL CITRATE (PF) 100 MCG/2ML IJ SOLN
INTRAMUSCULAR | Status: AC
  Administered 2017-09-26: 50 ug via INTRAVENOUS
  Filled 2017-09-26: qty 2

## 2017-09-26 MED ORDER — ONDANSETRON HCL 4 MG/2ML IJ SOLN
INTRAMUSCULAR | Status: DC | PRN
  Administered 2017-09-26: 4 mg via INTRAVENOUS

## 2017-09-26 MED ORDER — PROPOFOL 10 MG/ML IV BOLUS
INTRAVENOUS | Status: DC | PRN
  Administered 2017-09-26: 150 mg via INTRAVENOUS
  Administered 2017-09-26: 50 mg via INTRAVENOUS

## 2017-09-26 MED ORDER — SODIUM CHLORIDE 0.9 % IV SOLN
INTRAVENOUS | Status: DC | PRN
  Administered 2017-09-26: 50 ug/min via INTRAVENOUS

## 2017-09-26 MED ORDER — LACTATED RINGERS IV SOLN: INTRAVENOUS | Status: DC

## 2017-09-26 MED ORDER — CEFOXITIN SODIUM-DEXTROSE 2-2.2 GM-%(50ML) IV SOLR
INTRAVENOUS | Status: AC
  Filled 2017-09-26: qty 50

## 2017-09-26 MED ORDER — PROPOFOL 10 MG/ML IV BOLUS
INTRAVENOUS | Status: AC
  Filled 2017-09-26: qty 20

## 2017-09-26 MED ORDER — MORPHINE SULFATE (PF) 4 MG/ML IV SOLN: 1.0000 mg | INTRAVENOUS | Status: DC | PRN

## 2017-09-26 MED ORDER — ACETAMINOPHEN NICU IV SYRINGE 10 MG/ML
INTRAVENOUS | Status: AC
Start: 2017-09-26 — End: 2017-09-26
  Filled 2017-09-26: qty 1

## 2017-09-26 MED ORDER — GLYCOPYRROLATE 0.2 MG/ML IJ SOLN
INTRAMUSCULAR | Status: DC | PRN
  Administered 2017-09-26: 0.1 mg via INTRAVENOUS

## 2017-09-26 MED ORDER — PROMETHAZINE HCL 25 MG/ML IJ SOLN: 6.2500 mg | INTRAMUSCULAR | Status: DC | PRN

## 2017-09-26 MED ORDER — OXYCODONE-ACETAMINOPHEN 5-325 MG PO TABS: 1.0000 | ORAL_TABLET | ORAL | 0 refills | Status: DC | PRN

## 2017-09-26 MED ORDER — OXYCODONE-ACETAMINOPHEN 5-325 MG PO TABS
1.0000 | ORAL_TABLET | ORAL | Status: DC | PRN
  Administered 2017-09-26: 1 via ORAL

## 2017-09-26 MED ORDER — ROCURONIUM BROMIDE 50 MG/5ML IV SOLN
INTRAVENOUS | Status: AC
  Filled 2017-09-26: qty 1

## 2017-09-26 MED ORDER — HYDROMORPHONE HCL 1 MG/ML IJ SOLN
INTRAMUSCULAR | Status: DC | PRN
  Administered 2017-09-26 (×2): 1 mg via INTRAVENOUS

## 2017-09-26 MED ORDER — MEPERIDINE HCL 50 MG/ML IJ SOLN
12.5000 mg | INTRAMUSCULAR | Status: DC | PRN
  Administered 2017-09-26: 12.5 mg via INTRAVENOUS

## 2017-09-26 MED ORDER — KETOROLAC TROMETHAMINE 30 MG/ML IJ SOLN
INTRAMUSCULAR | Status: DC | PRN
  Administered 2017-09-26: 30 mg via INTRAVENOUS

## 2017-09-26 MED ORDER — ACETAMINOPHEN 325 MG PO TABS: 650.0000 mg | ORAL_TABLET | ORAL | Status: DC | PRN

## 2017-09-26 MED ORDER — LIDOCAINE HCL (CARDIAC) 20 MG/ML IV SOLN
INTRAVENOUS | Status: DC | PRN
  Administered 2017-09-26: 50 mg via INTRAVENOUS

## 2017-09-26 MED ORDER — DEXAMETHASONE SODIUM PHOSPHATE 10 MG/ML IJ SOLN
INTRAMUSCULAR | Status: AC
Start: 2017-09-26 — End: 2017-09-26
  Filled 2017-09-26: qty 1

## 2017-09-26 MED ORDER — MEPERIDINE HCL 50 MG/ML IJ SOLN
INTRAMUSCULAR | Status: AC
  Administered 2017-09-26: 12.5 mg via INTRAVENOUS
  Filled 2017-09-26: qty 1

## 2017-09-26 MED ORDER — MIDAZOLAM HCL 5 MG/5ML IJ SOLN
INTRAMUSCULAR | Status: AC
  Filled 2017-09-26: qty 5

## 2017-09-26 MED ORDER — BUPIVACAINE HCL (PF) 0.5 % IJ SOLN
INTRAMUSCULAR | Status: DC | PRN
  Administered 2017-09-26: 12 mL

## 2017-09-26 MED ORDER — PHENYLEPHRINE HCL 10 MG/ML IJ SOLN
INTRAMUSCULAR | Status: AC
  Filled 2017-09-26: qty 1

## 2017-09-26 MED ORDER — FAMOTIDINE 20 MG PO TABS
20.0000 mg | ORAL_TABLET | Freq: Once | ORAL | Status: AC
  Administered 2017-09-26: 20 mg via ORAL

## 2017-09-26 MED ORDER — LORAZEPAM 2 MG/ML IJ SOLN
INTRAMUSCULAR | Status: AC
  Administered 2017-09-26: 0.5 mg via INTRAVENOUS
  Filled 2017-09-26: qty 1

## 2017-09-26 MED ORDER — PHENYLEPHRINE HCL 10 MG/ML IJ SOLN
INTRAMUSCULAR | Status: DC | PRN
  Administered 2017-09-26 (×2): 100 ug via INTRAVENOUS
  Administered 2017-09-26: 200 ug via INTRAVENOUS
  Administered 2017-09-26 (×2): 100 ug via INTRAVENOUS
  Administered 2017-09-26 (×2): 200 ug via INTRAVENOUS

## 2017-09-26 MED ORDER — MIDAZOLAM HCL 2 MG/2ML IJ SOLN
INTRAMUSCULAR | Status: DC | PRN
  Administered 2017-09-26: 3 mg via INTRAVENOUS
  Administered 2017-09-26: 2 mg via INTRAVENOUS

## 2017-09-26 MED ORDER — DEXAMETHASONE SODIUM PHOSPHATE 10 MG/ML IJ SOLN
INTRAMUSCULAR | Status: DC | PRN
  Administered 2017-09-26: 10 mg via INTRAVENOUS

## 2017-09-26 MED ORDER — LIDOCAINE HCL (PF) 2 % IJ SOLN
INTRAMUSCULAR | Status: AC
  Filled 2017-09-26: qty 10

## 2017-09-26 MED ORDER — KETOROLAC TROMETHAMINE 30 MG/ML IJ SOLN
INTRAMUSCULAR | Status: AC
  Filled 2017-09-26: qty 1

## 2017-09-26 SURGICAL SUPPLY — 81 items
BAG COUNTER SPONGE EZ (MISCELLANEOUS) ×4 IMPLANT
BAG URINE DRAINAGE (UROLOGICAL SUPPLIES) ×5 IMPLANT
BLADE SURG SZ11 CARB STEEL (BLADE) ×5 IMPLANT
CANISTER SUCT 1200ML W/VALVE (MISCELLANEOUS) ×5 IMPLANT
CATH FOLEY 2WAY  5CC 16FR (CATHETERS) ×2
CATH URTH 16FR FL 2W BLN LF (CATHETERS) ×3 IMPLANT
CHLORAPREP W/TINT 26ML (MISCELLANEOUS) ×5 IMPLANT
CLOSURE WOUND 1/2 X4 (GAUZE/BANDAGES/DRESSINGS) ×1
COUNTER SPONGE BAG EZ (MISCELLANEOUS) ×1
DEFOGGER SCOPE WARMER CLEARIFY (MISCELLANEOUS) ×5 IMPLANT
DERMABOND ADVANCED (GAUZE/BANDAGES/DRESSINGS) ×2
DERMABOND ADVANCED .7 DNX12 (GAUZE/BANDAGES/DRESSINGS) ×3 IMPLANT
DEVICE SUTURE ENDOST 10MM (ENDOMECHANICALS) ×5 IMPLANT
DRAPE CAMERA CLOSED 9X96 (DRAPES) ×5 IMPLANT
DRAPE LAPAROTOMY 100X77 ABD (DRAPES) ×5 IMPLANT
DRAPE LAPAROTOMY TRNSV 106X77 (MISCELLANEOUS) ×5 IMPLANT
DRSG TEGADERM 2-3/8X2-3/4 SM (GAUZE/BANDAGES/DRESSINGS) IMPLANT
DRSG TELFA 3X8 NADH (GAUZE/BANDAGES/DRESSINGS) ×5 IMPLANT
ELECT BLADE 6 FLAT ULTRCLN (ELECTRODE) ×5 IMPLANT
ELECT CAUTERY BLADE 6.4 (BLADE) ×5 IMPLANT
ELECT REM PT RETURN 9FT ADLT (ELECTROSURGICAL) ×5
ELECTRODE REM PT RTRN 9FT ADLT (ELECTROSURGICAL) ×3 IMPLANT
ENDOSTITCH 0 SINGLE 48 (SUTURE) IMPLANT
GAUZE SPONGE 4X4 12PLY STRL (GAUZE/BANDAGES/DRESSINGS) ×5 IMPLANT
GLOVE BIO SURGEON STRL SZ8 (GLOVE) ×25 IMPLANT
GLOVE INDICATOR 8.0 STRL GRN (GLOVE) ×5 IMPLANT
GOWN STRL REUS W/ TWL LRG LVL3 (GOWN DISPOSABLE) ×3 IMPLANT
GOWN STRL REUS W/ TWL XL LVL3 (GOWN DISPOSABLE) ×6 IMPLANT
GOWN STRL REUS W/TWL LRG LVL3 (GOWN DISPOSABLE) ×2
GOWN STRL REUS W/TWL XL LVL3 (GOWN DISPOSABLE) ×4
GRASPER SUT TROCAR 14GX15 (MISCELLANEOUS) ×5 IMPLANT
HOLDER FOLEY CATH W/STRAP (MISCELLANEOUS) ×5 IMPLANT
IRRIGATION STRYKERFLOW (MISCELLANEOUS) ×3 IMPLANT
IRRIGATOR STRYKERFLOW (MISCELLANEOUS) ×5
IV LACTATED RINGERS 1000ML (IV SOLUTION) ×10 IMPLANT
KIT PINK PAD W/HEAD ARE REST (MISCELLANEOUS) ×5
KIT PINK PAD W/HEAD ARM REST (MISCELLANEOUS) ×3 IMPLANT
KIT TURNOVER CYSTO (KITS) ×5 IMPLANT
KIT TURNOVER KIT A (KITS) ×5 IMPLANT
LABEL OR SOLS (LABEL) ×5 IMPLANT
MANIPULATOR VCARE LG CRV RETR (MISCELLANEOUS) ×5 IMPLANT
MANIPULATOR VCARE SML CRV RETR (MISCELLANEOUS) IMPLANT
MANIPULATOR VCARE STD CRV RETR (MISCELLANEOUS) IMPLANT
NEEDLE VERESS 14GA 120MM (NEEDLE) ×5 IMPLANT
NS IRRIG 1000ML POUR BTL (IV SOLUTION) ×5 IMPLANT
NS IRRIG 500ML POUR BTL (IV SOLUTION) ×5 IMPLANT
OCCLUDER COLPOPNEUMO (BALLOONS) ×5 IMPLANT
PACK BASIN MAJOR ARMC (MISCELLANEOUS) ×5 IMPLANT
PACK GYN LAPAROSCOPIC (MISCELLANEOUS) ×5 IMPLANT
PAD OB MATERNITY 4.3X12.25 (PERSONAL CARE ITEMS) ×5 IMPLANT
PAD PREP 24X41 OB/GYN DISP (PERSONAL CARE ITEMS) ×5 IMPLANT
SCISSORS METZENBAUM CVD 33 (INSTRUMENTS) ×5 IMPLANT
SET CYSTO W/LG BORE CLAMP LF (SET/KITS/TRAYS/PACK) ×5 IMPLANT
SHEARS HARMONIC ACE PLUS 36CM (ENDOMECHANICALS) ×5 IMPLANT
SLEEVE ENDOPATH XCEL 5M (ENDOMECHANICALS) ×5 IMPLANT
SPONGE GAUZE 2X2 8PLY STER LF (GAUZE/BANDAGES/DRESSINGS)
SPONGE GAUZE 2X2 8PLY STRL LF (GAUZE/BANDAGES/DRESSINGS) IMPLANT
SPONGE LAP 18X18 5 PK (GAUZE/BANDAGES/DRESSINGS) ×5 IMPLANT
STAPLER SKIN PROX 35W (STAPLE) ×5 IMPLANT
STRIP CLOSURE SKIN 1/2X4 (GAUZE/BANDAGES/DRESSINGS) ×4 IMPLANT
SURGILUBE 2OZ TUBE FLIPTOP (MISCELLANEOUS) ×5 IMPLANT
SUT ENDO VLOC 180-0-8IN (SUTURE) ×5 IMPLANT
SUT ETHIBOND CT1 BRD #0 30IN (SUTURE) IMPLANT
SUT VIC AB 0 CT1 27 (SUTURE) ×2
SUT VIC AB 0 CT1 27XCR 8 STRN (SUTURE) ×3 IMPLANT
SUT VIC AB 0 CT1 36 (SUTURE) ×5 IMPLANT
SUT VIC AB 1 CT1 36 (SUTURE) IMPLANT
SUT VIC AB 2-0 SH 27 (SUTURE) ×2
SUT VIC AB 2-0 SH 27XBRD (SUTURE) ×3 IMPLANT
SUT VIC AB 4-0 FS2 27 (SUTURE) ×5 IMPLANT
SUT VIC AB 4-0 PS2 18 (SUTURE) IMPLANT
SUT VIC AB 4-0 SH 27 (SUTURE) ×2
SUT VIC AB 4-0 SH 27XANBCTRL (SUTURE) ×3 IMPLANT
SYR 10ML LL (SYRINGE) ×5 IMPLANT
SYR 30ML LL (SYRINGE) ×5 IMPLANT
SYR 50ML LL SCALE MARK (SYRINGE) ×5 IMPLANT
TRAY FOLEY W/METER SILVER 16FR (SET/KITS/TRAYS/PACK) ×5 IMPLANT
TRAY PREP VAG/GEN (MISCELLANEOUS) ×5 IMPLANT
TROCAR ENDO BLADELESS 11MM (ENDOMECHANICALS) ×5 IMPLANT
TROCAR XCEL NON-BLD 5MMX100MML (ENDOMECHANICALS) ×5 IMPLANT
TUBING INSUF HEATED (TUBING) ×5 IMPLANT

## 2017-09-26 NOTE — Progress Notes (Signed)
Dr. Kenton Kingfisher called with bladder scan results

## 2017-09-26 NOTE — Transfer of Care (Signed)
Immediate Anesthesia Transfer of Care Note  Patient: Bethany Evans  Procedure(s) Performed: HYSTERECTOMY TOTAL LAPAROSCOPIC BILATERAL SALPINGECTOMY (N/A Abdomen) CYSTOSCOPY (N/A Bladder)  Patient Location: PACU  Anesthesia Type:General  Level of Consciousness: sedated  Airway & Oxygen Therapy: Patient Spontanous Breathing and Patient connected to face mask oxygen  Post-op Assessment: Report given to RN and Post -op Vital signs reviewed and stable  Post vital signs: Reviewed  Last Vitals:  Vitals Value Taken Time  BP 99/35 09/26/2017 12:02 PM  Temp    Pulse 58 09/26/2017 12:03 PM  Resp 16 09/26/2017 12:03 PM  SpO2 100 % 09/26/2017 12:03 PM  Vitals shown include unvalidated device data.  Last Pain:  Vitals:   09/26/17 0839  TempSrc: Temporal         Complications: No apparent anesthesia complications

## 2017-09-26 NOTE — Progress Notes (Signed)
Bladder scanned x2    68ml noted

## 2017-09-26 NOTE — Anesthesia Post-op Follow-up Note (Signed)
Anesthesia QCDR form completed.        

## 2017-09-26 NOTE — Discharge Instructions (Addendum)
Total Laparoscopic Hysterectomy, Care After °Refer to this sheet in the next few weeks. These instructions provide you with information on caring for yourself after your procedure. Your health care provider may also give you more specific instructions. Your treatment has been planned according to current medical practices, but problems sometimes occur. Call your health care provider if you have any problems or questions after your procedure. °What can I expect after the procedure? °· Pain and bruising at the incision sites. You will be given pain medicine to control it. °· Menopausal symptoms such as hot flashes, night sweats, and insomnia if your ovaries were removed. °· Sore throat from the breathing tube that was inserted during surgery. °Follow these instructions at home: °· Only take over-the-counter or prescription medicines for pain, discomfort, or fever as directed by your health care provider. °· Do not take aspirin. It can cause bleeding. °· Do not drive when taking pain medicine. °· Follow your health care provider's advice regarding diet, exercise, lifting, driving, and general activities. °· Resume your usual diet as directed and allowed. °· Get plenty of rest and sleep. °· Do not douche, use tampons, or have sexual intercourse for at least 6 weeks, or until your health care provider gives you permission. °· Change your bandages (dressings) as directed by your health care provider. °· Monitor your temperature and notify your health care provider of a fever. °· Take showers instead of baths for 2-3 weeks. °· Do not drink alcohol until your health care provider gives you permission. °· If you develop constipation, you may take a mild laxative with your health care provider's permission. Bran foods may help with constipation problems. Drinking enough fluids to keep your urine clear or pale yellow may help as well. °· Try to have someone home with you for 1-2 weeks to help around the house. °· Keep all of  your follow-up appointments as directed by your health care provider. °Contact a health care provider if: °· You have swelling, redness, or increasing pain around your incision sites. °· You have pus coming from your incision. °· You notice a bad smell coming from your incision. °· Your incision breaks open. °· You feel dizzy or lightheaded. °· You have pain or bleeding when you urinate. °· You have persistent diarrhea. °· You have persistent nausea and vomiting. °· You have abnormal vaginal discharge. °· You have a rash. °· You have any type of abnormal reaction or develop an allergy to your medicine. °· You have poor pain control with your prescribed medicine. °Get help right away if: °· You have chest pain or shortness of breath. °· You have severe abdominal pain that is not relieved with pain medicine. °· You have pain or swelling in your legs. °This information is not intended to replace advice given to you by your health care provider. Make sure you discuss any questions you have with your health care provider. °Document Released: 04/08/2013 Document Revised: 11/24/2015 Document Reviewed: 01/06/2013 °Elsevier Interactive Patient Education © 2017 [employer] ° °AMBULATORY SURGERY  °DISCHARGE INSTRUCTIONS ° ° °1) The drugs that you were given will stay in your system until tomorrow so for the next 24 hours you should not: ° °A) Drive an automobile °B) Make any legal decisions °C) Drink any alcoholic beverage ° ° °2) You may resume regular meals tomorrow.  Today it is better to start with liquids and gradually work up to solid foods. ° °You may eat anything you prefer, but it is better   to start with liquids, then soup and crackers, and gradually work up to solid foods. ° ° °3) Please notify your doctor immediately if you have any unusual bleeding, trouble breathing, redness and pain at the surgery site, drainage, fever, or pain not relieved by medication. ° ° ° °4) Additional Instructions: ° ° ° ° ° ° ° °Please  contact your physician with any problems or Same Day Surgery at [phone], Monday through Friday 6 am to 4 pm, or [place] [facility] [place] Main number at [phone]. ° °

## 2017-09-26 NOTE — OR Nursing (Signed)
Notified Dr. Rosey Bath that patient is sitting up taking and drinking water but diastolic pressure running low.

## 2017-09-26 NOTE — Progress Notes (Signed)
Dr. Kenton Kingfisher into see

## 2017-09-26 NOTE — Op Note (Signed)
Operative Report:  PRE-OP DIAGNOSIS: FIBROID UTERUS,PELVIC PRESSURE,URINARY FREQUENCY AND URGENCY   POST-OP DIAGNOSIS: FIBROID UTERUS,PELVIC PRESSURE,URINARY FREQUENCY AND URGENCY   PROCEDURE: Procedure(s): HYSTERECTOMY TOTAL LAPAROSCOPIC BILATERAL SALPINGECTOMY CYSTOSCOPY  SURGEON: Barnett Applebaum, MD, FACOG  ASSISTANT: Dr Glennon Mac, No other capable assistant available, in surgery requiring high level assistant.  ANESTHESIA: General endotracheal anesthesia  ESTIMATED BLOOD LOSS: 200 mL  SPECIMENS: Uterus, Tubes.  COMPLICATIONS: None  DISPOSITION: stable to PACU  FINDINGS: Intraabdominal adhesions were not noted. Large multi-fibroid uterus. Normal ovaries.  PROCEDURE:  The patient was taken to the OR where anesthesia was administed. She was prepped and draped in the normal sterile fashion in the dorsal lithotomy position in the [place]on stirrups. A time out was performed. A Graves speculum was inserted, the cervix was grasped with a single tooth tenaculum and the endometrial cavity was sounded. The cervix was progressively dilated to a size 18 [place] with [employer] dilators. A V-Care uterine manipulator was inserted in the usual fashion without incident. Gloves were changed and attention was turned to the abdomen.   An infraumbilical transverse 40mm skin incision was made with the scalpel after local anesthesia applied to the skin. A Veress-step needle was inserted in the usual fashion and confirmed using the hanging drop technique. A pneumoperitoneum was obtained by insufflation of CO2 (opening pressure of 67mmHg) to 41mmHg. A diagnostic laparoscopy was performed yielding the previously described findings. Attention was turned to the left lower quadrant where after visualization of the inferior epigastric vessels a 75mm skin incision was made with the scalpel. A 5 mm laparoscopic port was inserted. The same procedure was repeated in the right lower quadrant with a 79mm trocar. Attention was turned to  the left aspect of the uterus, where after visualization of the ureter, the round ligament was coagulated and transected using the 11mm Harmonic Scapel. The anterior and posterior leafs of the broad ligament were dissected off as the anterior one was coagulated and transected in a caudal direction towards the cuff of the uterine manipulator.  Attention was then turned to the left fallopian tube which was recognized by visualization of the fimbria. The tube is excised to its attachment to the uterus. The uterine-ovarian ligament and its blood vessels were carefully coagulated and transected using the Harmonic scapel.  Attention was turned to the right aspect of the uterus where the same procedure was performed.  The vesicouterine reflection of the peritoneum was dissected with the harmonic scapel and the bladder flap was created bluntly.  The uterine vessels were coagulated and transected bilaterally using first bipolar cautery and then the harmonic scapel. A 360 degree, circumferential colpotomy was done to completely amputate the uterus with cervix and tubes. Once the specimen was amputated it was delivered through the vagina, with the assistance of vaginal morcellation to completely remove the uterus and fibriods..   The colpotomy was repaired in a simple running fashion using a delayed absorbable suture with an endo-stitch device.  Vaginal exam confirms complete closure.  The cavity was copiously irrigated. A survey of the pelvic cavity revealed adequate hemostasis and no injury to bowel, bladder, or ureter.   A diagnostic cystoscopy was performed using saline distension of bladder with no lesions or injuries noted.  Bilateral urine flow from each ureteral orifice is visualized.  At this point the procedure was finalized. Right lower quadrant fascia incision is closed with a vicryl suture using the fascia closure device. All the instruments were removed from the patient's body. Gas was expelled and patient  is leveled.  Incisions are closed with skin adhesive.    Patient goes to recovery room in stable condition.  All sponge, instrument, and needle counts are correct x2.     Barnett Applebaum, MD, [facility], [facility] 09/26/2017  12:02 PM

## 2017-09-26 NOTE — OR Nursing (Signed)
Dr. Rosey Bath notified patient complaining of the shakes, feeling anxious, and itching all over. He order ativan

## 2017-09-26 NOTE — Anesthesia Procedure Notes (Signed)
Procedure Name: Intubation Date/Time: 09/26/2017 9:43 AM Performed by: Nile Riggs, CRNA Pre-anesthesia Checklist: Patient identified, Emergency Drugs available, Suction available, Patient being monitored and Timeout performed Patient Re-evaluated:Patient Re-evaluated prior to induction Oxygen Delivery Method: Circle system utilized Preoxygenation: Pre-oxygenation with 100% oxygen Induction Type: IV induction and Cricoid Pressure applied Ventilation: Mask ventilation without difficulty Laryngoscope Size: Miller and 2 Tube type: Oral Tube size: 7.5 mm Number of attempts: 1 Airway Equipment and Method: Stylet Placement Confirmation: ETT inserted through vocal cords under direct vision,  positive ETCO2,  CO2 detector and breath sounds checked- equal and bilateral Secured at: 20 cm Tube secured with: Tape Dental Injury: Teeth and Oropharynx as per pre-operative assessment

## 2017-09-26 NOTE — H&P (Signed)
History and Physical Interval Note:  09/26/2017 7:43 AM  Bethany Evans  has presented today for surgery, with the diagnosis of FIBROID [place]  The various methods of treatment have been discussed with the patient and family. After consideration of risks, benefits and other options for treatment, the patient has consented to  Procedure(s): HYSTERECTOMY - TOTAL LAPAROSCOPIC with BILATERAL SALPINGECTOMY CYSTOSCOPY  POSSIBLE LAPAROTOMY  as a surgical intervention .  The patient's history has been reviewed, patient examined, no change in status, stable for surgery.  Pt has the following beta blocker history-  Not taking Beta Blocker.  I have reviewed the patient's chart and labs.  Questions were answered to the patient's satisfaction.    Barnett Applebaum, MD, [facility], [facility] 09/26/2017  7:43 AM

## 2017-09-26 NOTE — Anesthesia Preprocedure Evaluation (Signed)
Anesthesia Evaluation  Patient identified by MRN, date of birth, ID band Patient awake    Reviewed: Allergy & Precautions, H&P , NPO status , Patient's Chart, lab work & pertinent test results  History of Anesthesia Complications Negative for: history of anesthetic complications  Airway Mallampati: II  TM Distance: >3 FB Neck ROM: full    Dental  (+) Dental Advidsory Given, Teeth Intact   Pulmonary neg pulmonary ROS, former smoker,           Cardiovascular Exercise Tolerance: Good negative cardio ROS       Neuro/Psych PSYCHIATRIC DISORDERS Anxiety negative neurological ROS     GI/Hepatic Neg liver ROS, GERD  ,  Endo/Other  negative endocrine ROS  Renal/GU negative Renal ROS  negative genitourinary   Musculoskeletal   Abdominal   Peds  Hematology negative hematology ROS (+)   Anesthesia Other Findings Past Medical History: 09/2016: Abnormal mammogram of left breast No date: Anxiety No date: Bacterial vaginosis No date: Degenerative disc disease, cervical     Comment:  no issues currently No date: [place] Patient infection No date: GERD (gastroesophageal reflux disease) No date: Menometrorrhagia No date: Motion sickness     Comment:  boats 07/2017: Pelvic mass   Reproductive/Obstetrics negative OB ROS                             Anesthesia Physical  Anesthesia Plan  ASA: II  Anesthesia Plan: General   Post-op Pain Management:    Induction: Intravenous  PONV Risk Score and Plan: 3 and Ondansetron, Dexamethasone and Treatment may vary due to age or medical condition  Airway Management Planned: Oral ETT  Additional Equipment:   Intra-op Plan:   Post-operative Plan: Extubation in OR  Informed Consent: I have reviewed the patients History and Physical, chart, labs and discussed the procedure including the risks, benefits and alternatives for the proposed anesthesia with  the patient or authorized representative who has indicated his/her understanding and acceptance.     Plan Discussed with:   Anesthesia Plan Comments:         Anesthesia Quick Evaluation

## 2017-09-27 ENCOUNTER — Encounter: Payer: Self-pay | Admitting: Obstetrics & Gynecology

## 2017-09-27 LAB — SURGICAL PATHOLOGY

## 2017-09-27 NOTE — Anesthesia Postprocedure Evaluation (Signed)
Anesthesia Post Note  Patient: Bethany Evans  Procedure(s) Performed: HYSTERECTOMY TOTAL LAPAROSCOPIC BILATERAL SALPINGECTOMY (N/A Abdomen) CYSTOSCOPY (N/A Bladder)  Patient location during evaluation: PACU Anesthesia Type: General Level of consciousness: awake and alert Pain management: pain level controlled Vital Signs Assessment: post-procedure vital signs reviewed and stable Respiratory status: spontaneous breathing, nonlabored ventilation, respiratory function stable and patient connected to nasal cannula oxygen Cardiovascular status: blood pressure returned to baseline and stable Postop Assessment: no apparent nausea or vomiting Anesthetic complications: no     Last Vitals:  Vitals:   09/26/17 1506 09/26/17 1625  BP: (!) 101/40 (!) 101/35  Pulse: 91 76  Resp:    Temp:    SpO2: 100% 100%    Last Pain:  Vitals:   09/27/17 0802  TempSrc:   PainSc: 5                  Martha Clan

## 2017-10-02 ENCOUNTER — Encounter: Payer: Self-pay | Admitting: Obstetrics & Gynecology

## 2017-10-02 NOTE — Telephone Encounter (Signed)
Please advise 

## 2017-10-11 ENCOUNTER — Ambulatory Visit (INDEPENDENT_AMBULATORY_CARE_PROVIDER_SITE_OTHER): Payer: Managed Care, Other (non HMO) | Admitting: Obstetrics & Gynecology

## 2017-10-11 ENCOUNTER — Ambulatory Visit: Payer: Self-pay | Admitting: Family Medicine

## 2017-10-11 ENCOUNTER — Encounter: Payer: Self-pay | Admitting: Obstetrics & Gynecology

## 2017-10-11 VITALS — BP 119/55 | HR 80 | Temp 98.8°F | Resp 20

## 2017-10-11 VITALS — BP 100/60 | HR 59 | Ht 64.0 in | Wt 123.0 lb

## 2017-10-11 DIAGNOSIS — G8929 Other chronic pain: Secondary | ICD-10-CM

## 2017-10-11 DIAGNOSIS — D250 Submucous leiomyoma of uterus: Secondary | ICD-10-CM

## 2017-10-11 DIAGNOSIS — D251 Intramural leiomyoma of uterus: Secondary | ICD-10-CM

## 2017-10-11 DIAGNOSIS — R1013 Epigastric pain: Principal | ICD-10-CM

## 2017-10-11 NOTE — Progress Notes (Signed)
  Postoperative Follow-up Patient presents post op from [facility], BS for fibroids, 2 weeks ago. Pathology: DIAGNOSIS:  A. UTERUS WITH CERVIX AND BILATERAL FALLOPIAN TUBES; TOTAL HYSTERECTOMY  WITH BILATERAL SALPINGECTOMY:  - CHRONIC CERVICITIS.  - PROLIFERATIVE ENDOMETRIUM.  - MYOMETRIUM WITH ADENOMYOSIS AND LEIOMYOMATA, LARGEST FRAGMENT  MEASURING 7.1 CM.  - TWO UNREMARKABLE FALLOPIAN TUBES.  - NEGATIVE FOR ATYPIA AND MALIGNANCY.  Subjective: Patient reports marked improvement in her preop symptoms. Eating a regular diet without difficulty. The patient is not having any pain.  Activity: normal activities of daily living. Patient reports vaginal sx's of Irregular bleeding  Objective: BP 100/60   Pulse (!) 59   Ht 5\' 4"  (1.626 m)   Wt 123 lb (55.8 kg)   BMI 21.11 kg/m  Physical Exam  Constitutional: She is oriented to person, place, and time. She appears well-developed and well-nourished. No distress.  Genitourinary: Rectum normal and vagina normal. Pelvic exam was performed with patient supine. There is no rash, tenderness or lesion on the right labia. There is no rash, tenderness or lesion on the left labia. No erythema or bleeding in the vagina. Right adnexum does not display mass and does not display tenderness. Left adnexum does not display mass and does not display tenderness.  Genitourinary Comments: Cervix and uterus absent. Vaginal cuff healing well.  Cardiovascular: Normal rate.  Pulmonary/Chest: Effort normal.  Abdominal: Soft. She exhibits no distension. There is no tenderness.  Incision healing well.  Musculoskeletal: Normal range of motion.  Neurological: She is alert and oriented to person, place, and time. No cranial nerve deficit.  Skin: Skin is warm and dry.  Psychiatric: She has a normal mood and affect.   Assessment: s/p :  total laparoscopic hysterectomy with bilateral salpingectomy progressing well  Plan: Patient has done well after surgery with no apparent  complications.  I have discussed the post-operative course to date, and the expected progress moving forward.  The patient understands what complications to be concerned about.  I will see the patient in routine follow up, or sooner if needed.    Activity plan: No restriction. Pelvic rest RTW next week  Hoyt Koch 10/11/2017, 9:17 AM

## 2017-10-11 NOTE — Progress Notes (Signed)
Subjective: abdominal pain      Bethany Evans is a 42 y.o. female who presents requesting a referral to a gastroenterologist.  Patient reports the purpose of this is for evaluation of epigastric pain that she has had intermittently for 14 years.  Patient denies previous workup for this.  Patient reports seeing Dr. Allen Norris in the past (11/2016) for change in her bowel habits reports a normal colonoscopy at that time.  Patient denies reporting her symptoms of epigastric pain to him.  Patient reports that 14 years ago she was seen in the ER and they told her it might be a stomach ulcer (not diagnosed through testing) but she has not had this reevaluated.  Patient recently underwent a hysterectomy on 09/26/17 for leiomyomas.  Patient reports initially she was having issues with fatigue, back pain, dizziness that resolved.  She saw her surgeon this morning and reports no abnormal findings or post-op complications.  Patient denies a history of H. pylori or any other GI history.  Patient denies radiation of pain.  Patient reports intermittent pain when she takes NSAIDs for the last 14 years.  Currently asymptomatic.  Denies vomiting, diarrhea, blood in her stool, fevers, weight loss, fatigue, malaise, or any other symptoms.  Patient reports occasional intermittent nausea.  Patient describes the pain as a dull ache with no association with meals or BMs.  Patient reports taking Tums on occasion.  Denies any other known alleviating or aggravating factors.  Denies any other associated symptoms.  Patient does not currently see a PCP but uses her OB/GYN for this.  Review of Systems Pertinent items noted in HPI and remainder of comprehensive ROS otherwise negative.     Objective:    General appearance: alert, cooperative, appears stated age and no distress Abdomen: soft, bowel sounds normal; no masses,  no organomegaly.  Healing incisions to lower abdomen from hysterectomy with no signs of infection.  Patient still has  tenderness in her lower abdomen so did not palpate deeply.  Otherwise abdomen is nontender.  Skin: Skin color, texture, turgor normal. No rashes or lesions Lymph nodes: No lymphadenopathy noted. Neurologic: Grossly normal    Assessment:   Abdominal pain, non specific    Plan:   Initial labs ordered to aid in the evaluation of her pain when she presents to her first appointment with Dr. Allen Norris. Patient recently had a normal CBC.  Discussed limited availability of testing in our clinic and the limitations of the H. Pylori antibody testing. Discussed the effects of certain medications on testing for H. Pylori.  Advised avoiding NSAIDs and other mucosal irritants if possible until further evaluation and since NSAID use causes pain.    Further follow-up plans will be based on outcome of lab/imaging studies; see orders.   Red flag symptoms and indications to seek medical care discussed.

## 2017-10-14 LAB — COMPREHENSIVE METABOLIC PANEL
ALT: 10 IU/L (ref 0–32)
AST: 16 IU/L (ref 0–40)
Albumin/Globulin Ratio: 1.6 (ref 1.2–2.2)
Albumin: 4.2 g/dL (ref 3.5–5.5)
Alkaline Phosphatase: 74 IU/L (ref 39–117)
BUN/Creatinine Ratio: 17 (ref 9–23)
BUN: 16 mg/dL (ref 6–24)
CALCIUM: 9.3 mg/dL (ref 8.7–10.2)
CO2: 18 mmol/L — AB (ref 20–29)
CREATININE: 0.95 mg/dL (ref 0.57–1.00)
Chloride: 104 mmol/L (ref 96–106)
GFR calc non Af Amer: 75 mL/min/{1.73_m2} (ref >59)
GFR, EST AFRICAN AMERICAN: 86 mL/min/{1.73_m2} (ref >59)
GLUCOSE: 86 mg/dL (ref 65–99)
Globulin, Total: 2.7 g/dL (ref 1.5–4.5)
Potassium: 4.8 mmol/L (ref 3.5–5.2)
Sodium: 140 mmol/L (ref 134–144)
TOTAL PROTEIN: 6.9 g/dL (ref 6.0–8.5)

## 2017-10-14 LAB — H. PYLORI ANTIBODY, IGG

## 2017-10-14 LAB — AMYLASE: Amylase: 149 U/L — ABNORMAL HIGH (ref 31–124)

## 2017-10-14 LAB — LIPASE: Lipase: 24 U/L (ref 14–72)

## 2017-10-16 ENCOUNTER — Ambulatory Visit
Admission: RE | Admit: 2017-10-16 | Discharge: 2017-10-16 | Disposition: A | Discharge status: Home / Self Care | Payer: Managed Care, Other (non HMO) | Source: Ambulatory Visit | Attending: Obstetrics and Gynecology | Admitting: Obstetrics and Gynecology

## 2017-10-16 DIAGNOSIS — Z1239 Encounter for other screening for malignant neoplasm of breast: Secondary | ICD-10-CM

## 2017-10-16 DIAGNOSIS — N6321 Unspecified lump in the left breast, upper outer quadrant: Secondary | ICD-10-CM | POA: Diagnosis not present

## 2017-10-16 DIAGNOSIS — R928 Other abnormal and inconclusive findings on diagnostic imaging of breast: Secondary | ICD-10-CM | POA: Insufficient documentation

## 2017-10-16 NOTE — Progress Notes (Signed)
[name], I wanted to let you know that your lab results came back and everything is normal with the exception of your amylase, which is slightly elevated.  Your amylase level is 149, normal results are between 31 and 124 (although can be up to 140).  I made the referral to Dr. Allen Norris as you requested and want you to follow-up with him regarding your lab results. If your symptoms recur in the meantime we can repeat this test and consider getting an ultrasound of your abdomen but I think it would be more appropriate for him to guide testing if you aren't having significant symptoms and plan to see him in the next few weeks.

## 2017-10-17 ENCOUNTER — Other Ambulatory Visit: Payer: Self-pay | Admitting: Obstetrics and Gynecology

## 2017-10-17 DIAGNOSIS — R928 Other abnormal and inconclusive findings on diagnostic imaging of breast: Secondary | ICD-10-CM

## 2017-10-17 DIAGNOSIS — N6320 Unspecified lump in the left breast, unspecified quadrant: Secondary | ICD-10-CM

## 2017-11-11 ENCOUNTER — Ambulatory Visit: Payer: Managed Care, Other (non HMO) | Admitting: Obstetrics & Gynecology

## 2017-11-20 ENCOUNTER — Encounter: Payer: Self-pay | Admitting: Obstetrics & Gynecology

## 2017-11-20 ENCOUNTER — Ambulatory Visit: Payer: Managed Care, Other (non HMO) | Admitting: Obstetrics & Gynecology

## 2017-11-20 ENCOUNTER — Ambulatory Visit (INDEPENDENT_AMBULATORY_CARE_PROVIDER_SITE_OTHER): Payer: Managed Care, Other (non HMO) | Admitting: Obstetrics & Gynecology

## 2017-11-20 VITALS — BP 120/70 | Ht 64.0 in | Wt 125.0 lb

## 2017-11-20 DIAGNOSIS — D251 Intramural leiomyoma of uterus: Secondary | ICD-10-CM

## 2017-11-20 DIAGNOSIS — D250 Submucous leiomyoma of uterus: Secondary | ICD-10-CM

## 2017-11-20 NOTE — Progress Notes (Signed)
  Postoperative Follow-up Patient presents post op from [facility] for fibroids, 6 weeks ago.  Subjective: Patient reports marked improvement in her preop symptoms. Eating a regular diet without difficulty. The patient is not having any pain.  Activity: normal activities of daily living. Patient reports vaginal sx's of None  Objective: BP 120/70   Ht 5\' 4"  (1.626 m)   Wt 125 lb (56.7 kg)   LMP 08/26/2017   BMI 21.46 kg/m  Physical Exam  Constitutional: She is oriented to person, place, and time. She appears well-developed and well-nourished. No distress.  Genitourinary: Rectum normal and vagina normal. Pelvic exam was performed with patient supine. There is no rash, tenderness or lesion on the right labia. There is no rash, tenderness or lesion on the left labia. No erythema or bleeding in the vagina. Right adnexum does not display mass and does not display tenderness. Left adnexum does not display mass and does not display tenderness.  Genitourinary Comments: Cervix and uterus absent. Vaginal cuff healing well.  Cardiovascular: Normal rate.  Pulmonary/Chest: Effort normal.  Abdominal: Soft. She exhibits no distension. There is no tenderness.  Incision healing well.  Musculoskeletal: Normal range of motion.  Neurological: She is alert and oriented to person, place, and time. No cranial nerve deficit.  Skin: Skin is warm and dry.  Psychiatric: She has a normal mood and affect.    Assessment: s/p :  total laparoscopic hysterectomy with bilateral salpingectomy progressing well  Plan: Patient has done well after surgery with no apparent complications.  I have discussed the post-operative course to date, and the expected progress moving forward.  The patient understands what complications to be concerned about.  I will see the patient in routine follow up, or sooner if needed.    Activity plan: No restriction.  Monitor for menopause sx's over time  Hoyt Koch 11/20/2017, 4:48  PM

## 2017-11-22 ENCOUNTER — Ambulatory Visit
Admission: RE | Admit: 2017-11-22 | Discharge: 2017-11-22 | Disposition: A | Discharge status: Home / Self Care | Payer: Managed Care, Other (non HMO) | Source: Ambulatory Visit | Attending: Obstetrics and Gynecology | Admitting: Obstetrics and Gynecology

## 2017-11-22 DIAGNOSIS — R928 Other abnormal and inconclusive findings on diagnostic imaging of breast: Secondary | ICD-10-CM | POA: Diagnosis not present

## 2017-11-22 DIAGNOSIS — N6320 Unspecified lump in the left breast, unspecified quadrant: Secondary | ICD-10-CM

## 2017-11-22 HISTORY — PX: BREAST BIOPSY: SHX20

## 2017-11-26 LAB — SURGICAL PATHOLOGY

## 2017-12-02 ENCOUNTER — Ambulatory Visit (INDEPENDENT_AMBULATORY_CARE_PROVIDER_SITE_OTHER): Payer: Managed Care, Other (non HMO) | Admitting: Gastroenterology

## 2017-12-02 ENCOUNTER — Other Ambulatory Visit: Payer: Self-pay

## 2017-12-02 ENCOUNTER — Encounter: Payer: Self-pay | Admitting: Gastroenterology

## 2017-12-02 ENCOUNTER — Telehealth: Payer: Self-pay | Admitting: Obstetrics and Gynecology

## 2017-12-02 VITALS — BP 111/53 | HR 76 | Ht 64.0 in | Wt 129.0 lb

## 2017-12-02 DIAGNOSIS — R112 Nausea with vomiting, unspecified: Secondary | ICD-10-CM

## 2017-12-02 DIAGNOSIS — R928 Other abnormal and inconclusive findings on diagnostic imaging of breast: Secondary | ICD-10-CM

## 2017-12-02 DIAGNOSIS — R1013 Epigastric pain: Secondary | ICD-10-CM

## 2017-12-02 DIAGNOSIS — G8929 Other chronic pain: Secondary | ICD-10-CM

## 2017-12-02 DIAGNOSIS — N6320 Unspecified lump in the left breast, unspecified quadrant: Secondary | ICD-10-CM

## 2017-12-02 NOTE — Telephone Encounter (Signed)
LM confirming LT breast bx results. Repeat dx mammo and u/s due in 6 months. Orders placed.

## 2017-12-02 NOTE — Telephone Encounter (Signed)
Lmtrc

## 2017-12-02 NOTE — Progress Notes (Signed)
Primary Care Physician: Chad Cordial, PA-C  Primary Gastroenterologist:  Dr. Lucilla Lame  Chief Complaint  Patient presents with  . Abdominal Pain    HPI: Bethany Evans is a 42 y.o. female here with a history of having a change in bowel habits with diarrhea.  The patient had a colonoscopy in 2018 with random biopsies throughout the entire colon showing normal colon tissue without any pathology or inflammation.  The patient had recently been seen in urgent care with a report of intermittent abdominal pain for the last 14 years.  The patient had a normal lipase but a slightly elevated amylase at 149.  The patient's lipase was 24 with the upper limit of normal being 72.   The patient is now concerned that she may have a ulcer.The patient reports that she has epigastric pain and is exacerbated by taking anti-inflammatory medication.  The patient has tried PPIs in the past but did not want to stay on them so she has been taking Tums as needed.  The patient also reports that she had increased symptoms around the time she was on vacation in [place] and thinks it may have been related to her alcohol intake.The patient also reports that her epigastric pain is associated with nausea.  Current Outpatient Medications  Medication Sig Dispense Refill  . loratadine (CLARITIN) 10 MG tablet TAKE 1 TABLET BY MOUTH DAILY 30 tablet 12  . LORazepam (ATIVAN) 0.5 MG tablet TAKE 1 TABLET BY MOUTH AS NEEDED SYMPTOMS 30 tablet 0  . calcium carbonate (TUMS - DOSED IN MG ELEMENTAL CALCIUM) 500 MG chewable tablet Chew 1 tablet by mouth daily as needed for indigestion or heartburn.     No current facility-administered medications for this visit.     Allergies as of 12/02/2017 - Review Complete 12/02/2017  Allergen Reaction Noted  . Doxycycline Swelling 09/02/2014    ROS:  General: Negative for anorexia, weight loss, fever, chills, fatigue, weakness. ENT: Negative for hoarseness, difficulty swallowing ,  nasal congestion. CV: Negative for chest pain, angina, palpitations, dyspnea on exertion, peripheral edema.  Respiratory: Negative for dyspnea at rest, dyspnea on exertion, cough, sputum, wheezing.  GI: See history of present illness. GU:  Negative for dysuria, hematuria, urinary incontinence, urinary frequency, nocturnal urination.  Endo: Negative for unusual weight change.    Physical Examination:   BP (!) 111/53   Pulse 76   Ht _0  (1.626 m)   Wt 129 lb (58.5 kg)   LMP 08/26/2017   BMI 22.14 kg/m   General: Well-nourished, well-developed in no acute distress.  Eyes: No icterus. Conjunctivae pink. Mouth: Oropharyngeal mucosa moist and pink , no lesions erythema or exudate. Lungs: Clear to auscultation bilaterally. Non-labored. Heart: Regular rate and rhythm, no murmurs rubs or gallops.  Abdomen: Bowel sounds are normal, nontender, nondistended, no hepatosplenomegaly or masses, no abdominal bruits or hernia , no rebound or guarding.   Extremities: No lower extremity edema. No clubbing or deformities. Neuro: Alert and oriented x 3.  Grossly intact. Skin: Warm and dry, no jaundice.   Psych: Alert and cooperative, normal mood and affect.  Labs:  See HPI  Imaging Studies: Mm Clip Placement Left  Result Date: 11/22/2017 CLINICAL DATA:  Post ultrasound-guided core needle biopsy of left breast 1:30 o'clock nodule. EXAM: DIAGNOSTIC LEFT MAMMOGRAM POST ULTRASOUND BIOPSY COMPARISON:  Previous exam(s). FINDINGS: Mammographic images were obtained following ultrasound guided biopsy of left breast. Two-view mammography demonstrates presence of ribbon shaped marker within the biopsy site  in the left breast upper outer quadrant, in appropriate mammographic position. IMPRESSION: Successful placement of ribbon shaped marker post ultrasound-guided core biopsy of left breast 1:30 o'clock mass. Final Assessment: Post Procedure Mammograms for Marker Placement Electronically Signed   By: Fidela Salisbury M.D.   On: 11/22/2017 09:55   [place] Lt Breast Bx W Loc Dev 1st Lesion Img Bx Spec US Guide  Addendum Date: 11/27/2017   ADDENDUM REPORT: 11/26/2017 17:04 ADDENDUM: Pathology of the left breast biopsy revealed A. LEFT BREAST, 1:30, 4 CM FROM NIPPLE; ULTRASOUND-GUIDED CORE BIOPSY: FIBROEPITHELIAL LESION, MOST CONSISTENT WITH FIBROADENOMA. NEGATIVE FOR ATYPIA AND MALIGNANCY. This was found to be concordant by Dr. Jetta Lout. Recommendation: Six month follow-up diagnostic mammogram and ultrasound left breast. At the patient's request, results and recommendations were relayed to the patient by phone by Jetta Lout, [place] on 11/26/17. The patient stated she has a bruise and her breast is very tender. Post biopsy instructions were reviewed with the patient and all of her questions were answered. She was encouraged to contact the [facility] with any further questions or concerns. Addendum by Jetta Lout, RRA on 11/26/17. Electronically Signed   By: Fidela Salisbury M.D.   On: 11/26/2017 17:04   Result Date: 11/27/2017 CLINICAL DATA:  Left breast 1:30 o'clock nodule. EXAM: ULTRASOUND GUIDED LEFT BREAST CORE NEEDLE BIOPSY COMPARISON:  Previous exam(s). FINDINGS: I met with the patient and we discussed the procedure of ultrasound-guided biopsy, including benefits and alternatives. We discussed the high likelihood of a successful procedure. We discussed the risks of the procedure, including infection, bleeding, tissue injury, clip migration, and inadequate sampling. Informed written consent was given. The usual time-out protocol was performed immediately prior to the procedure. Lesion quadrant: Upper outer quadrant Using sterile technique and 1% Lidocaine as local anesthetic, under direct ultrasound visualization, a 14 gauge spring-loaded device was used to perform biopsy of left breast 1:30 o'clock nodule using a inferior approach. At the conclusion of the procedure a ribbon shaped tissue marker clip  was deployed into the biopsy cavity. Follow up 2 view mammogram was performed and dictated separately. IMPRESSION: Ultrasound guided biopsy of left breast.  No apparent complications. Electronically Signed: By: Fidela Salisbury M.D. On: 11/22/2017 09:56    Assessment and Plan:   Bethany Evans is a 42 y.o. y/o female who comes in with epigastric pain that has been present for many years.  She states that the symptoms have been present for approximately 14 years and have been intermittent.  The patient takes anti-inflammatory medication for joint pain. The patient has been given samples of Dexilant to see if this helps her symptoms including her nausea. The patient will also be set up for a upper endoscopy to rule out peptic ulcer disease. I have discussed risks & benefits which include, but are not limited to, bleeding, infection, perforation & drug reaction.  The patient agrees with this plan & written consent will be obtained.       Lucilla Lame, MD. Marval Regal   Note: This dictation was prepared with Dragon dictation along with smaller phrase technology. Any transcriptional errors that result from this process are unintentional.

## 2017-12-03 ENCOUNTER — Other Ambulatory Visit: Payer: Self-pay

## 2017-12-03 DIAGNOSIS — R112 Nausea with vomiting, unspecified: Secondary | ICD-10-CM

## 2017-12-03 NOTE — Telephone Encounter (Signed)
Patient is aware of appointment, and said she saw it on MyChart.

## 2018-01-01 ENCOUNTER — Encounter: Payer: Self-pay | Admitting: *Deleted

## 2018-01-01 ENCOUNTER — Other Ambulatory Visit: Payer: Self-pay

## 2018-01-09 NOTE — Discharge Instructions (Signed)
General Anesthesia, Adult, Care After °These instructions provide you with information about caring for yourself after your procedure. Your health care provider may also give you more specific instructions. Your treatment has been planned according to current medical practices, but problems sometimes occur. Call your health care provider if you have any problems or questions after your procedure. °What can I expect after the procedure? °After the procedure, it is common to have: °· Vomiting. °· A sore throat. °· Mental slowness. ° °It is common to feel: °· Nauseous. °· Cold or shivery. °· Sleepy. °· Tired. °· Sore or achy, even in parts of your body where you did not have surgery. ° °Follow these instructions at home: °For at least 24 hours after the procedure: °· Do not: °? Participate in activities where you could fall or become injured. °? Drive. °? Use heavy machinery. °? Drink alcohol. °? Take sleeping pills or medicines that cause drowsiness. °? Make important decisions or sign legal documents. °? Take care of children on your own. °· Rest. °Eating and drinking °· If you vomit, drink water, juice, or soup when you can drink without vomiting. °· Drink enough fluid to keep your urine clear or pale yellow. °· Make sure you have little or no nausea before eating solid foods. °· Follow the diet recommended by your health care provider. °General instructions °· Have a responsible adult stay with you until you are awake and alert. °· Return to your normal activities as told by your health care provider. Ask your health care provider what activities are safe for you. °· Take over-the-counter and prescription medicines only as told by your health care provider. °· If you smoke, do not smoke without supervision. °· Keep all follow-up visits as told by your health care provider. This is important. °Contact a health care provider if: °· You continue to have nausea or vomiting at home, and medicines are not helpful. °· You  cannot drink fluids or start eating again. °· You cannot urinate after 8-12 hours. °· You develop a skin rash. °· You have fever. °· You have increasing redness at the site of your procedure. °Get help right away if: °· You have difficulty breathing. °· You have chest pain. °· You have unexpected bleeding. °· You feel that you are having a life-threatening or urgent problem. °This information is not intended to replace advice given to you by your health care provider. Make sure you discuss any questions you have with your health care provider. °Document Released: 09/24/2000 Document Revised: 11/21/2015 Document Reviewed: 06/02/2015 °Elsevier Interactive Patient Education © 2018 Elsevier Inc. ° °

## 2018-01-10 ENCOUNTER — Ambulatory Visit: Payer: Managed Care, Other (non HMO) | Admitting: Anesthesiology

## 2018-01-10 ENCOUNTER — Encounter
Admission: RE | Disposition: A | Discharge status: Home / Self Care | Payer: Self-pay | Source: Ambulatory Visit | Attending: Gastroenterology

## 2018-01-10 ENCOUNTER — Encounter: Payer: Self-pay | Admitting: *Deleted

## 2018-01-10 ENCOUNTER — Ambulatory Visit
Admission: RE | Admit: 2018-01-10 | Discharge: 2018-01-10 | Disposition: A | Discharge status: Home / Self Care | Payer: Managed Care, Other (non HMO) | Source: Ambulatory Visit | Attending: Gastroenterology | Admitting: Gastroenterology

## 2018-01-10 DIAGNOSIS — R1013 Epigastric pain: Secondary | ICD-10-CM | POA: Diagnosis not present

## 2018-01-10 DIAGNOSIS — F419 Anxiety disorder, unspecified: Secondary | ICD-10-CM | POA: Insufficient documentation

## 2018-01-10 DIAGNOSIS — K2950 Unspecified chronic gastritis without bleeding: Secondary | ICD-10-CM | POA: Diagnosis not present

## 2018-01-10 DIAGNOSIS — K219 Gastro-esophageal reflux disease without esophagitis: Secondary | ICD-10-CM | POA: Insufficient documentation

## 2018-01-10 DIAGNOSIS — Z79899 Other long term (current) drug therapy: Secondary | ICD-10-CM | POA: Diagnosis not present

## 2018-01-10 DIAGNOSIS — Z87891 Personal history of nicotine dependence: Secondary | ICD-10-CM | POA: Diagnosis not present

## 2018-01-10 HISTORY — PX: ESOPHAGOGASTRODUODENOSCOPY (EGD) WITH PROPOFOL: SHX5813

## 2018-01-10 SURGERY — ESOPHAGOGASTRODUODENOSCOPY (EGD) WITH PROPOFOL
Anesthesia: General | Wound class: Clean Contaminated

## 2018-01-10 MED ORDER — GLYCOPYRROLATE 0.2 MG/ML IJ SOLN
INTRAMUSCULAR | Status: DC | PRN
  Administered 2018-01-10: 0.2 mg via INTRAVENOUS

## 2018-01-10 MED ORDER — ACETAMINOPHEN 160 MG/5ML PO SOLN: 325.0000 mg | ORAL | Status: DC | PRN

## 2018-01-10 MED ORDER — PROPOFOL 10 MG/ML IV BOLUS
INTRAVENOUS | Status: DC | PRN
  Administered 2018-01-10 (×2): 30 mg via INTRAVENOUS
  Administered 2018-01-10: 70 mg via INTRAVENOUS

## 2018-01-10 MED ORDER — ACETAMINOPHEN 325 MG PO TABS: 650.0000 mg | ORAL_TABLET | Freq: Once | ORAL | Status: DC | PRN

## 2018-01-10 MED ORDER — LIDOCAINE HCL (CARDIAC) PF 100 MG/5ML IV SOSY
PREFILLED_SYRINGE | INTRAVENOUS | Status: DC | PRN
  Administered 2018-01-10: 100 mg via INTRAVENOUS

## 2018-01-10 MED ORDER — LACTATED RINGERS IV SOLN
INTRAVENOUS | Status: DC
  Administered 2018-01-10 (×2): via INTRAVENOUS

## 2018-01-10 MED ORDER — ONDANSETRON HCL 4 MG/2ML IJ SOLN: 4.0000 mg | Freq: Once | INTRAMUSCULAR | Status: DC | PRN

## 2018-01-10 SURGICAL SUPPLY — 33 items
BALLN DILATOR 10-12 8 (BALLOONS)
BALLN DILATOR 12-15 8 (BALLOONS)
BALLN DILATOR 15-18 8 (BALLOONS)
BALLN DILATOR CRE 0-12 8 (BALLOONS)
BALLN DILATOR ESOPH 8 10 CRE (MISCELLANEOUS) IMPLANT
BALLOON DILATOR 12-15 8 (BALLOONS) IMPLANT
BALLOON DILATOR 15-18 8 (BALLOONS) IMPLANT
BALLOON DILATOR CRE 0-12 8 (BALLOONS) IMPLANT
BLOCK BITE 60FR ADLT L/F GRN (MISCELLANEOUS) ×3 IMPLANT
CANISTER SUCT 1200ML W/VALVE (MISCELLANEOUS) ×3 IMPLANT
CLIP HMST 235XBRD CATH ROT (MISCELLANEOUS) IMPLANT
CLIP RESOLUTION 360 11X235 (MISCELLANEOUS)
ELECT REM PT RETURN 9FT ADLT (ELECTROSURGICAL)
ELECTRODE REM PT RTRN 9FT ADLT (ELECTROSURGICAL) IMPLANT
FCP ESCP3.2XJMB 240X2.8X (MISCELLANEOUS)
FORCEPS BIOP RAD 4 LRG CAP 4 (CUTTING FORCEPS) IMPLANT
FORCEPS BIOP RJ4 240 W/NDL (MISCELLANEOUS)
FORCEPS ESCP3.2XJMB 240X2.8X (MISCELLANEOUS) IMPLANT
GOWN CVR UNV OPN BCK APRN NK (MISCELLANEOUS) ×2 IMPLANT
GOWN ISOL THUMB LOOP REG UNIV (MISCELLANEOUS) ×4
INJECTOR VARIJECT VIN23 (MISCELLANEOUS) IMPLANT
KIT DEFENDO VALVE AND CONN (KITS) IMPLANT
KIT ENDO PROCEDURE OLY (KITS) ×3 IMPLANT
MARKER SPOT ENDO TATTOO 5ML (MISCELLANEOUS) IMPLANT
RETRIEVER NET PLAT FOOD (MISCELLANEOUS) IMPLANT
SNARE SHORT THROW 13M SML OVAL (MISCELLANEOUS) IMPLANT
SNARE SHORT THROW 30M LRG OVAL (MISCELLANEOUS) IMPLANT
SPOT EX ENDOSCOPIC TATTOO (MISCELLANEOUS)
SYR INFLATION 60ML (SYRINGE) IMPLANT
TRAP ETRAP POLY (MISCELLANEOUS) IMPLANT
VARIJECT INJECTOR VIN23 (MISCELLANEOUS)
WATER STERILE IRR 250ML POUR (IV SOLUTION) ×3 IMPLANT
WIRE CRE 18-20MM 8CM F G (MISCELLANEOUS) IMPLANT

## 2018-01-10 NOTE — Anesthesia Procedure Notes (Signed)
Procedure Name: MAC Date/Time: 01/10/2018 9:51 AM Performed by: Lind Guest, CRNA Pre-anesthesia Checklist: Emergency Drugs available, Patient identified, Suction available, Patient being monitored and Timeout performed Patient Re-evaluated:Patient Re-evaluated prior to induction Oxygen Delivery Method: Nasal cannula

## 2018-01-10 NOTE — Anesthesia Postprocedure Evaluation (Signed)
Anesthesia Post Note  Patient: Bethany Evans  Procedure(s) Performed: ESOPHAGOGASTRODUODENOSCOPY (EGD) WITH biopsy (N/A )  Patient location during evaluation: PACU Anesthesia Type: General Level of consciousness: awake and alert, oriented and patient cooperative Pain management: pain level controlled Vital Signs Assessment: post-procedure vital signs reviewed and stable Respiratory status: spontaneous breathing, nonlabored ventilation and respiratory function stable Cardiovascular status: blood pressure returned to baseline and stable Postop Assessment: adequate PO intake Anesthetic complications: no    Darrin Nipper

## 2018-01-10 NOTE — Transfer of Care (Signed)
Immediate Anesthesia Transfer of Care Note  Patient: Bethany Evans  Procedure(s) Performed: ESOPHAGOGASTRODUODENOSCOPY (EGD) WITH PROPOFOL (N/A )  Patient Location: PACU  Anesthesia Type: General  Level of Consciousness: awake, alert  and patient cooperative  Airway and Oxygen Therapy: Patient Spontanous Breathing and Patient connected to supplemental oxygen  Post-op Assessment: Post-op Vital signs reviewed, Patient's Cardiovascular Status Stable, Respiratory Function Stable, Patent Airway and No signs of Nausea or vomiting  Post-op Vital Signs: Reviewed and stable  Complications: No apparent anesthesia complications

## 2018-01-10 NOTE — Anesthesia Preprocedure Evaluation (Signed)
Anesthesia Evaluation  Patient identified by MRN, date of birth, ID band Patient awake    Reviewed: Allergy & Precautions, NPO status , Patient's Chart, lab work & pertinent test results  History of Anesthesia Complications Negative for: history of anesthetic complications  Airway Mallampati: II  TM Distance: >3 FB Neck ROM: Full    Dental no notable dental hx.    Pulmonary former smoker (quit 2014),    Pulmonary exam normal breath sounds clear to auscultation       Cardiovascular Exercise Tolerance: Good negative cardio ROS Normal cardiovascular exam Rhythm:Regular Rate:Normal     Neuro/Psych PSYCHIATRIC DISORDERS Anxiety negative neurological ROS     GI/Hepatic GERD  ,  Endo/Other  negative endocrine ROS  Renal/GU negative Renal ROS     Musculoskeletal   Abdominal   Peds  Hematology negative hematology ROS (+)   Anesthesia Other Findings   Reproductive/Obstetrics                             Anesthesia Physical Anesthesia Plan  ASA: II  Anesthesia Plan: General   Post-op Pain Management:    Induction: Intravenous  PONV Risk Score and Plan: 3 and Propofol infusion and TIVA  Airway Management Planned: Natural Airway  Additional Equipment:   Intra-op Plan:   Post-operative Plan:   Informed Consent: I have reviewed the patients History and Physical, chart, labs and discussed the procedure including the risks, benefits and alternatives for the proposed anesthesia with the patient or authorized representative who has indicated his/her understanding and acceptance.     Plan Discussed with: CRNA  Anesthesia Plan Comments:         Anesthesia Quick Evaluation

## 2018-01-10 NOTE — Op Note (Signed)
[facility] Patient Name: Bethany Evans Procedure Date: 01/10/2018 9:48 AM MRN: [id number] Account #: [id number] Date of Birth: [birth year] Admit Type: Outpatient Age: 42 Room: [facility] OR ROOM 01 Gender: Female Note Status: Finalized Procedure:            Upper GI endoscopy Indications:          Epigastric abdominal pain Providers:            Lucilla Lame MD, MD Medicines:            Propofol per Anesthesia Complications:        No immediate complications. Procedure:            Pre-Anesthesia Assessment:                       - Prior to the procedure, a History and Physical was                        performed, and patient medications and allergies were                        reviewed. The patient's tolerance of previous                        anesthesia was also reviewed. The risks and benefits of                        the procedure and the sedation options and risks were                        discussed with the patient. All questions were                        answered, and informed consent was obtained. Prior                        Anticoagulants: The patient has taken no previous                        anticoagulant or antiplatelet agents. ASA Grade                        Assessment: II - A patient with mild systemic disease.                        After reviewing the risks and benefits, the patient was                        deemed in satisfactory condition to undergo the                        procedure.                       After obtaining informed consent, the endoscope was                        passed under direct vision. Throughout the procedure,                        the patient's blood pressure,  pulse, and oxygen                        saturations were monitored continuously. The was                        introduced through the mouth, and advanced to the                        second part of duodenum. The upper GI endoscopy was                     accomplished without difficulty. The patient tolerated                        the procedure well. Findings:      The examined esophagus was normal.      The entire examined stomach was normal. Biopsies were taken with a cold       forceps for histology.      The examined duodenum was normal. Impression:           - Normal esophagus.                       - Normal stomach. Biopsied.                       - Normal examined duodenum. Recommendation:       - Discharge patient to home.                       - Resume previous diet.                       - Continue present medications.                       - Await pathology results. Procedure Code(s):    --- Professional ---                       [id number], Esophagogastroduodenoscopy, flexible, transoral;                        with biopsy, single or multiple Diagnosis Code(s):    --- Professional ---                       R10.13, Epigastric pain CPT copyright 2017 American Medical Association. All rights reserved. The codes documented in this report are preliminary and upon coder review may  be revised to meet current compliance requirements. Lucilla Lame MD, MD 01/10/2018 10:03:09 AM This report has been signed electronically. Number of Addenda: 0 Note Initiated On: 01/10/2018 9:48 AM Total Procedure Duration: 0 hours 2 minutes 12 seconds       [facility]

## 2018-01-10 NOTE — H&P (Signed)
Bethany Lame, MD [address] [place], [place] [address] [phone] Fax : [phone]  Primary Care Physician:  Amalia Greenhouse Primary Gastroenterologist:  Dr. Allen Norris  Pre-Procedure History & Physical: HPI:  Bethany Evans is a 42 y.o. female is here for an endoscopy.   Past Medical History:  Diagnosis Date  . Abnormal mammogram of left breast 09/2016  . Anxiety   . Bacterial vaginosis   . Degenerative disc disease, cervical    no issues currently  . [place] Patient infection   . GERD (gastroesophageal reflux disease)   . Menometrorrhagia   . Motion sickness    boats  . Pelvic mass 07/2017    Past Surgical History:  Procedure Laterality Date  . BREAST BIOPSY Left 11/22/2017   US guided biopsy  . COLONOSCOPY WITH PROPOFOL N/A 11/30/2016   Procedure: COLONOSCOPY WITH PROPOFOL;  Surgeon: Bethany Lame, MD;  Location: [facility];  Service: Endoscopy;  Laterality: N/A; repeat 10 yrs  . CYSTOSCOPY N/A 09/26/2017   Procedure: CYSTOSCOPY;  Surgeon: Gae Dry, MD;  Location: [facility];  Service: Gynecology;  Laterality: N/A;  . HEAD & NECK SKIN LESION EXCISIONAL BIOPSY Left 1989   Face Benign  . LAPAROSCOPIC HYSTERECTOMY N/A 09/26/2017   Procedure: HYSTERECTOMY TOTAL LAPAROSCOPIC BILATERAL SALPINGECTOMY;  Surgeon: Gae Dry, MD;  Location: [facility];  Service: Gynecology;  Laterality: N/A;  . WISDOM TOOTH EXTRACTION     all 4 were impacted    Prior to Admission medications   Medication Sig Start Date End Date Taking? Authorizing Provider  loratadine (CLARITIN) 10 MG tablet TAKE 1 TABLET BY MOUTH DAILY 03/26/16  Yes Fisher, Linden Dolin, PA-C  LORazepam (ATIVAN) 0.5 MG tablet TAKE 1 TABLET BY MOUTH AS NEEDED SYMPTOMS 02/10/56  Yes Copland, Alicia B, PA-C  calcium carbonate (TUMS - DOSED IN MG ELEMENTAL CALCIUM) 500 MG chewable tablet Chew 1 tablet by mouth daily as needed for indigestion or heartburn.    [provider]     Allergies as of 12/03/2017 - Review Complete 12/02/2017  Allergen Reaction Noted  . Doxycycline Swelling 09/02/2014    Family History  Problem Relation Age of Onset  . Hyperlipidemia Father   . Melanoma Maternal Grandmother   . Pancreatic cancer Maternal Grandmother 60  . Melanoma Mother 42  . Breast cancer Neg Hx     Social History   Socioeconomic History  . Marital status: Single    Spouse name: Not on file  . Number of children: Not on file  . Years of education: Not on file  . Highest education level: Not on file  Occupational History  . Not on file  Social Needs  . Financial resource strain: Not on file  . Food insecurity:    Worry: Not on file    Inability: Not on file  . Transportation needs:    Medical: Not on file    Non-medical: Not on file  Tobacco Use  . Smoking status: Former Smoker    Packs/day: 0.50    Years: 10.00    Pack years: 5.00    Types: Cigarettes    Last attempt to quit: 07/02/2012    Years since quitting: 5.5  . Smokeless tobacco: Never Used  Substance and Sexual Activity  . Alcohol use: Yes    Alcohol/week: 1.2 oz    Types: 1 Glasses of wine, 1 Shots of liquor per week    Comment: Occasionally  . Drug use: No  .  Sexual activity: Yes    Birth control/protection: None  Lifestyle  . Physical activity:    Days per week: 4 days    Minutes per session: 50 min  . Stress: To some extent  Relationships  . Social connections:    Talks on phone: Twice a week    Gets together: Once a week    Attends religious service: Never    Active member of club or organization: Yes    Attends meetings of clubs or organizations: Never    Relationship status: Living with partner  . Intimate partner violence:    Fear of current or ex partner: No    Emotionally abused: No    Physically abused: No    Forced sexual activity: No  Other Topics Concern  . Not on file  Social History Narrative  . Not on file    Review of Systems: See HPI, otherwise  negative ROS  Physical Exam: Ht 5\' 4"  (1.626 m)   Wt 129 lb (58.5 kg)   LMP 08/26/2017   BMI 22.14 kg/m  General:   Alert,  pleasant and cooperative in NAD Head:  Normocephalic and atraumatic. Neck:  Supple; no masses or thyromegaly. Lungs:  Clear throughout to auscultation.    Heart:  Regular rate and rhythm. Abdomen:  Soft, nontender and nondistended. Normal bowel sounds, without guarding, and without rebound.   Neurologic:  Alert and  oriented x4;  grossly normal neurologically.  Impression/Plan: Bethany Evans is here for an endoscopy to be performed for epigastric pain  Risks, benefits, limitations, and alternatives regarding  endoscopy have been reviewed with the patient.  Questions have been answered.  All parties agreeable.   Bethany Lame, MD  01/10/2018, 9:21 AM

## 2018-01-13 ENCOUNTER — Encounter: Payer: Self-pay | Admitting: Gastroenterology

## 2018-01-21 ENCOUNTER — Encounter: Payer: Self-pay | Admitting: Obstetrics and Gynecology

## 2018-01-21 ENCOUNTER — Other Ambulatory Visit: Payer: Self-pay | Admitting: Obstetrics and Gynecology

## 2018-01-21 ENCOUNTER — Encounter: Payer: Self-pay | Admitting: Gastroenterology

## 2018-01-21 DIAGNOSIS — F419 Anxiety disorder, unspecified: Secondary | ICD-10-CM

## 2018-01-21 MED ORDER — LORAZEPAM 0.5 MG PO TABS: ORAL_TABLET | ORAL | 0 refills | Status: DC

## 2018-01-21 NOTE — Progress Notes (Signed)
Rx RF for pt

## 2018-01-24 ENCOUNTER — Other Ambulatory Visit: Payer: Self-pay

## 2018-01-24 MED ORDER — DEXLANSOPRAZOLE 60 MG PO CPDR: 60.0000 mg | DELAYED_RELEASE_CAPSULE | Freq: Every day | ORAL | 6 refills | Status: DC

## 2018-03-05 ENCOUNTER — Telehealth: Payer: Self-pay | Admitting: Gastroenterology

## 2018-03-05 NOTE — Telephone Encounter (Signed)
Results of pt's EGD of 01/10/18 were never sent to you. They were scanned in but not attached to you.  Please review and advise.

## 2018-03-05 NOTE — Telephone Encounter (Signed)
Patient states she never received results from procedure on 7.12.2019 and is requesting a call back from the nurse to discuss. Please call and document.

## 2018-03-05 NOTE — Telephone Encounter (Signed)
Pt notified of EGD results.  

## 2018-03-05 NOTE — Telephone Encounter (Signed)
Let the patient know she had some inflammation that appeared chronic without any active inflammation. No infection or cancerous cells seen. Can be from acid and from NSAID's.

## 2018-04-09 ENCOUNTER — Ambulatory Visit: Payer: Self-pay

## 2018-05-26 ENCOUNTER — Other Ambulatory Visit: Payer: Self-pay

## 2018-05-27 ENCOUNTER — Ambulatory Visit
Admission: RE | Admit: 2018-05-27 | Discharge: 2018-05-27 | Disposition: A | Discharge status: Home / Self Care | Payer: Managed Care, Other (non HMO) | Source: Ambulatory Visit | Attending: Obstetrics and Gynecology | Admitting: Obstetrics and Gynecology

## 2018-05-27 ENCOUNTER — Encounter: Payer: Self-pay | Admitting: Obstetrics and Gynecology

## 2018-05-27 DIAGNOSIS — R928 Other abnormal and inconclusive findings on diagnostic imaging of breast: Secondary | ICD-10-CM

## 2018-05-27 DIAGNOSIS — N6320 Unspecified lump in the left breast, unspecified quadrant: Secondary | ICD-10-CM

## 2018-08-17 NOTE — Progress Notes (Signed)
PCP:  Chad Cordial, PA-C   Chief Complaint  Patient presents with  . Gynecologic Exam     HPI:      [name] is a 43 y.o. No obstetric history on file. who LMP was Patient's last menstrual period was 08/26/2017., presents today for her annual examination.  Her menses are absent due to total lap hyst with bilat salpingectomy due to large leio 3/19 with Dr. Kenton Kingfisher. She does not have intermenstrual bleeding. Hx of mult leio on 2019 u/s.  Fibroid 1: 3.22 x 4.03 cm; Left posterior fundal, intramural Fibroid 2: 7.45 x 8.85 cm; Posterior fundal; intramural Fibroid 3: .79 x 1.27 cm; Right lateral fundal; intramural Fibroid 4: 1.69 x 1.95 cm; Rt lateral fundal, intramural Fibroid 5: 1.86 x 1.11 cm; Posterior LUS, subserosal  Sex activity: has sex with females. No vag pain. Last Pap: 07/18/17  Results were: no abnormalities /neg HPV DNA  Hx of STDs: none  Last mammogram: 10/16/17 Cat 3 due to stable LT breast mass. 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 Pt had cat 2 mammo 11/19 LT breast. Due to scr mammo 4/20.  There is no FH of breast cancer. There is no FH of ovarian cancer. The patient does do self-breast exams.   Tobacco use: The patient denies current or previous tobacco use. Alcohol use: none No drug use.  Exercise: very active  She does get adequate calcium but not Vitamin D in her diet.  She takes ativan sparingly prn anxiety and needs a RF.  Has PCP now and doing labs through her.  Past Medical History:  Diagnosis Date  . Abnormal mammogram of left breast 09/2016  . Anxiety   . Bacterial vaginosis   . Degenerative disc disease, cervical    no issues currently  . [facility] Patient infection   . GERD (gastroesophageal reflux disease)   . Menometrorrhagia   . Motion sickness    boats  . Pelvic mass 07/2017    Past Surgical History:  Procedure Laterality Date  . BREAST BIOPSY Left 11/22/2017   US guided biopsy, FIBROEPITHELIAL LESION, MOST CONSISTENT WITH  FIBROADENOMA  . COLONOSCOPY WITH PROPOFOL N/A 11/30/2016   Procedure: COLONOSCOPY WITH PROPOFOL;  Surgeon: Lucilla Lame, MD;  Location: [facility];  Service: Endoscopy;  Laterality: N/A; repeat 10 yrs  . CYSTOSCOPY N/A 09/26/2017   Procedure: CYSTOSCOPY;  Surgeon: Gae Dry, MD;  Location: [facility];  Service: Gynecology;  Laterality: N/A;  . ESOPHAGOGASTRODUODENOSCOPY (EGD) WITH PROPOFOL N/A 01/10/2018   Procedure: ESOPHAGOGASTRODUODENOSCOPY (EGD) WITH biopsy;  Surgeon: Lucilla Lame, MD;  Location: [facility];  Service: Endoscopy;  Laterality: N/A;  . HEAD & NECK SKIN LESION EXCISIONAL BIOPSY Left 1989   Face Benign  . LAPAROSCOPIC HYSTERECTOMY N/A 09/26/2017   Procedure: HYSTERECTOMY TOTAL LAPAROSCOPIC BILATERAL SALPINGECTOMY;  Surgeon: Gae Dry, MD;  Location: [facility];  Service: Gynecology;  Laterality: N/A;  . WISDOM TOOTH EXTRACTION     all 4 were impacted    Family History  Problem Relation Age of Onset  . Hyperlipidemia Father   . Melanoma Maternal Grandmother   . Pancreatic cancer Maternal Grandmother 60  . Melanoma Mother 39  . Breast cancer Neg Hx     Social History   Socioeconomic History  . Marital status: Single    Spouse name: Not on file  . Number of children: Not on file  . Years of education: Not on file  . Highest education level: Not on  file  Occupational History  . Not on file  Social Needs  . Financial resource strain: Not on file  . Food insecurity:    Worry: Not on file    Inability: Not on file  . Transportation needs:    Medical: Not on file    Non-medical: Not on file  Tobacco Use  . Smoking status: Former Smoker    Packs/day: 0.50    Years: 10.00    Pack years: 5.00    Types: Cigarettes    Last attempt to quit: 07/02/2012    Years since quitting: 6.1  . Smokeless tobacco: Never Used  Substance and Sexual Activity  . Alcohol use: Yes    Alcohol/week: 2.0 standard drinks    Types: 1 Glasses of wine, 1 Shots of  liquor per week    Comment: Occasionally  . Drug use: No  . Sexual activity: Yes    Birth control/protection: Surgical    Comment: Hysterectomy  Lifestyle  . Physical activity:    Days per week: 4 days    Minutes per session: 50 min  . Stress: To some extent  Relationships  . Social connections:    Talks on phone: Twice a week    Gets together: Once a week    Attends religious service: Never    Active member of club or organization: Yes    Attends meetings of clubs or organizations: Never    Relationship status: Living with partner  . Intimate partner violence:    Fear of current or ex partner: No    Emotionally abused: No    Physically abused: No    Forced sexual activity: No  Other Topics Concern  . Not on file  Social History Narrative  . Not on file    Current Meds  Medication Sig  . buPROPion (WELLBUTRIN XL) 150 MG 24 hr tablet Take by mouth.  . loratadine (CLARITIN) 10 MG tablet TAKE 1 TABLET BY MOUTH DAILY  . LORazepam (ATIVAN) 0.5 MG tablet TAKE 1 TABLET BY MOUTH AS NEEDED SYMPTOMS  . [DISCONTINUED] LORazepam (ATIVAN) 0.5 MG tablet TAKE 1 TABLET BY MOUTH AS NEEDED SYMPTOMS     ROS:  Review of Systems  Constitutional: Negative for fatigue, fever and unexpected weight change.  Respiratory: Negative for cough, shortness of breath and wheezing.   Cardiovascular: Negative for chest pain, palpitations and leg swelling.  Gastrointestinal: Negative for blood in stool, constipation, diarrhea, nausea and vomiting.  Endocrine: Negative for cold intolerance, heat intolerance and polyuria.  Genitourinary: Negative for dyspareunia, dysuria, flank pain, frequency, genital sores, hematuria, menstrual problem, pelvic pain, urgency, vaginal bleeding, vaginal discharge and vaginal pain.  Musculoskeletal: Negative for back pain, joint swelling and myalgias.  Skin: Negative for rash.  Neurological: Negative for dizziness, syncope, light-headedness, numbness and headaches.    Hematological: Negative for adenopathy.  Psychiatric/Behavioral: Positive for agitation. Negative for confusion, sleep disturbance and suicidal ideas. The patient is not nervous/anxious.      Objective: BP 100/60   Pulse 71   Ht 5\' 4"  (1.626 m)   Wt 131 lb (59.4 kg)   LMP 08/26/2017   BMI 22.49 kg/m    Physical Exam Constitutional:      Appearance: She is well-developed.  Genitourinary:     Vulva, vagina, right adnexa and left adnexa normal.     No vaginal discharge, erythema or tenderness.     Cervix is absent.     Uterus is absent.     No right or left  adnexal mass present.     Right adnexa not tender.     Left adnexa not tender.     Genitourinary Comments: UTERUS/CX SURG REM  Neck:     Musculoskeletal: Normal range of motion.     Thyroid: No thyromegaly.  Cardiovascular:     Rate and Rhythm: Normal rate and regular rhythm.     Heart sounds: Normal heart sounds. No murmur.  Pulmonary:     Effort: Pulmonary effort is normal.     Breath sounds: Normal breath sounds.  Chest:     Breasts:        Right: No mass, nipple discharge, skin change or tenderness.        Left: No mass, nipple discharge, skin change or tenderness.  Abdominal:     Palpations: Abdomen is soft.     Tenderness: There is no abdominal tenderness. There is no guarding.  Musculoskeletal: Normal range of motion.  Neurological:     Mental Status: She is alert and oriented to person, place, and time.     Cranial Nerves: No cranial nerve deficit.  Psychiatric:        Behavior: Behavior normal.  Vitals signs reviewed.     Assessment/Plan: Encounter for annual routine gynecological examination  Screening for breast cancer - Pt to sched mammo - Plan: MM 3D SCREEN BREAST BILATERAL  Anxiety - Rx RF ativan to take sparingly - Plan: LORazepam (ATIVAN) 0.5 MG tablet  Meds ordered this encounter  Medications  . LORazepam (ATIVAN) 0.5 MG tablet    Sig: TAKE 1 TABLET BY MOUTH AS NEEDED SYMPTOMS     Dispense:  30 tablet    Refill:  0    This request is for a new prescription for a controlled substance as required by Federal/State law.    Order Specific Question:   Supervising Provider    Answer:   Gae Dry [[id number]]             GYN counsel breast self exam, mammography screening, adequate intake of calcium and vitamin D, diet and exercise     F/U  Return in about 1 year (around 08/19/2019).   B. , PA-C 08/18/2018 9:16 AM

## 2018-08-18 ENCOUNTER — Encounter: Payer: Self-pay | Admitting: Obstetrics and Gynecology

## 2018-08-18 ENCOUNTER — Ambulatory Visit (INDEPENDENT_AMBULATORY_CARE_PROVIDER_SITE_OTHER): Payer: Managed Care, Other (non HMO) | Admitting: Obstetrics and Gynecology

## 2018-08-18 VITALS — BP 100/60 | HR 71 | Ht 64.0 in | Wt 131.0 lb

## 2018-08-18 DIAGNOSIS — Z01419 Encounter for gynecological examination (general) (routine) without abnormal findings: Secondary | ICD-10-CM | POA: Diagnosis not present

## 2018-08-18 DIAGNOSIS — F419 Anxiety disorder, unspecified: Secondary | ICD-10-CM

## 2018-08-18 DIAGNOSIS — Z1239 Encounter for other screening for malignant neoplasm of breast: Secondary | ICD-10-CM

## 2018-08-18 MED ORDER — LORAZEPAM 0.5 MG PO TABS: ORAL_TABLET | ORAL | 0 refills | Status: DC

## 2018-08-18 NOTE — Patient Instructions (Signed)
I value your feedback and entrusting us with your care. If you get a [facility] patient survey, I would appreciate you taking the time to let us know about your experience today. Thank you! 

## 2018-11-04 ENCOUNTER — Encounter: Payer: Self-pay | Admitting: Obstetrics and Gynecology

## 2018-11-27 ENCOUNTER — Encounter: Payer: Self-pay | Admitting: Obstetrics & Gynecology

## 2018-11-27 ENCOUNTER — Other Ambulatory Visit: Payer: Self-pay

## 2018-11-27 ENCOUNTER — Ambulatory Visit (INDEPENDENT_AMBULATORY_CARE_PROVIDER_SITE_OTHER): Payer: Managed Care, Other (non HMO) | Admitting: Obstetrics & Gynecology

## 2018-11-27 VITALS — BP 100/60 | Ht 64.0 in | Wt 130.0 lb

## 2018-11-27 DIAGNOSIS — N939 Abnormal uterine and vaginal bleeding, unspecified: Secondary | ICD-10-CM | POA: Diagnosis not present

## 2018-11-27 NOTE — Progress Notes (Signed)
HPI:      [name] is a 43 y.o. G1P1001 who LMP was prior to hysterectomy last year (for fibroids and bleeding), did well after surgery for the past year, yet presents today for a problem visit.  She complains of vaginal bleeding that is actually brown d/c most episodes, for the last 3 mos or longer, every 3-4 weeks notices; no heavy bleeding or pain; does notice ovulation twinge or pain at times (usu 22 day cycle).  Has not been sexually active.           PMHx: She  has a past medical history of Abnormal mammogram of left breast (09/2016), Anxiety, Bacterial vaginosis, Degenerative disc disease, cervical, Epstein Barr infection, GERD (gastroesophageal reflux disease), Menometrorrhagia, Motion sickness, and Pelvic mass (07/2017). Also,  has a past surgical history that includes Wisdom tooth extraction; Colonoscopy with propofol (N/A, 11/30/2016); Head & neck skin lesion excisional biopsy (Left, 1989); Laparoscopic hysterectomy (N/A, 09/26/2017); Cystoscopy (N/A, 09/26/2017); Esophagogastroduodenoscopy (egd) with propofol (N/A, 01/10/2018); and Breast biopsy (Left, 11/22/2017)., family history includes Hyperlipidemia in her father; Melanoma in her maternal grandmother; Melanoma (age of onset: 39) in her mother; Pancreatic cancer (age of onset: 38) in her maternal grandmother.,  reports that she quit smoking about 6 years ago. Her smoking use included cigarettes. She has a 5.00 pack-year smoking history. She has never used smokeless tobacco. She reports current alcohol use of about 2.0 standard drinks of alcohol per week. She reports that she does not use drugs.  She  Current Outpatient Medications:  .  buPROPion (WELLBUTRIN XL) 150 MG 24 hr tablet, Take by mouth., Disp: , Rfl:  .  loratadine (CLARITIN) 10 MG tablet, TAKE 1 TABLET BY MOUTH DAILY, Disp: 30 tablet, Rfl: 12 .  LORazepam (ATIVAN) 0.5 MG tablet, TAKE 1 TABLET BY MOUTH AS NEEDED SYMPTOMS, Disp: 30 tablet, Rfl: 0 .  Melatonin 1 MG TABS,  Take by mouth., Disp: , Rfl:   Also, is allergic to doxycycline.  Review of Systems  Constitutional: Negative for chills, fever and malaise/fatigue.  HENT: Negative for congestion, sinus pain and sore throat.   Eyes: Negative for blurred vision and pain.  Respiratory: Negative for cough and wheezing.   Cardiovascular: Negative for chest pain and leg swelling.  Gastrointestinal: Negative for abdominal pain, constipation, diarrhea, heartburn, nausea and vomiting.  Genitourinary: Negative for dysuria, frequency, hematuria and urgency.  Musculoskeletal: Negative for back pain, joint pain, myalgias and neck pain.  Skin: Negative for itching and rash.  Neurological: Negative for dizziness, tremors and weakness.  Endo/Heme/Allergies: Does not bruise/bleed easily.  Psychiatric/Behavioral: Negative for depression. The patient is not nervous/anxious and does not have insomnia.     Objective: BP 100/60   Ht 5\' 4"  (1.626 m)   Wt 130 lb (59 kg)   LMP 08/26/2017   BMI 22.31 kg/m  Physical Exam Constitutional:      General: She is not in acute distress.    Appearance: She is well-developed.  Genitourinary:     Pelvic exam was performed with patient supine.     Vagina normal.     No vaginal erythema or bleeding.     Genitourinary Comments: -Cuff intact/ no visible defect or lesions    Pinpoint area right pole of vag apex with blood on qtip when pressure applied there.    AgNO3 applied to this area even though no specific granulation tissue or defect seen   -Absent uterus and cervix  HENT:     Head:  Normocephalic and atraumatic.     Nose: Nose normal.  Abdominal:     General: There is no distension.     Palpations: Abdomen is soft.     Tenderness: There is no abdominal tenderness.  Musculoskeletal: Normal range of motion.  Neurological:     Mental Status: She is alert and oriented to person, place, and time.     Cranial Nerves: No cranial nerve deficit.  Skin:    General: Skin is warm  and dry.     ASSESSMENT/PLAN:   Problem List Items Addressed This Visit    Vaginal bleeding    -  Primary No s/sx dehiscence, separation, extensive granulation tissue or scarring AgNO3 treatment Monitor sx's No further tx necessary unless sx's are profuse    A total of 15 minutes were spent face-to-face with the patient during this encounter and over half of that time dealt with counseling and coordination of care.   Barnett Applebaum, MD, [facility], [facility] 11/27/2018  8:23 AM

## 2019-01-12 IMAGING — MG DIGITAL DIAGNOSTIC BILATERAL MAMMOGRAM WITH TOMO AND CAD
8 of 13 series · 8 of 29 positions shown · non-contrast
Comparison: Previous exam(s).

CLINICAL DATA: 41-year-old female presenting for follow-up of a
probably benign mass in the left breast.

EXAM:
DIGITAL DIAGNOSTIC BILATERAL MAMMOGRAM WITH CAD AND TOMO
LEFT BREAST ULTRASOUND

[L MLO (1 of 2)]
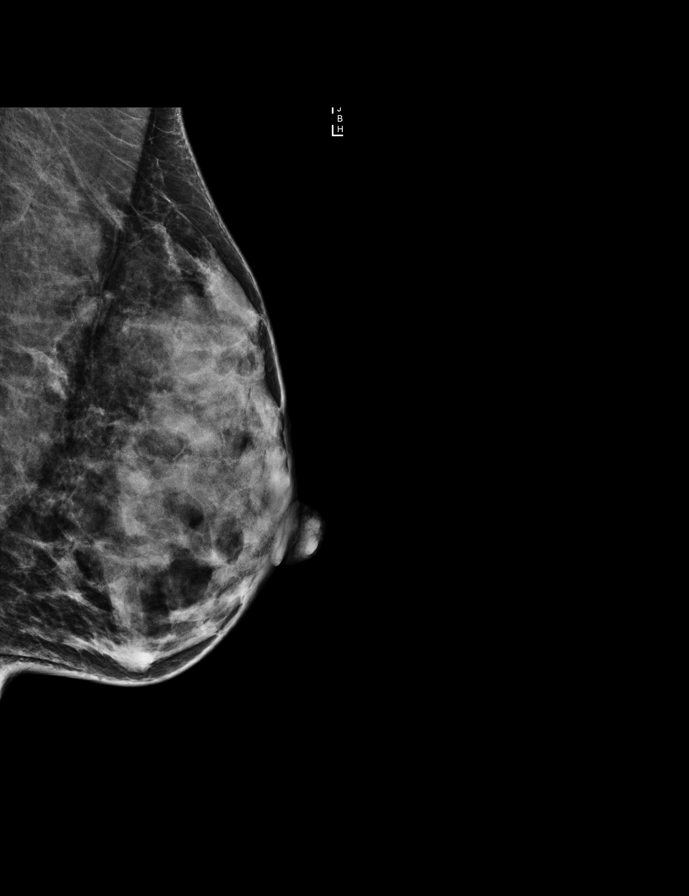

[R CC synth-2D]
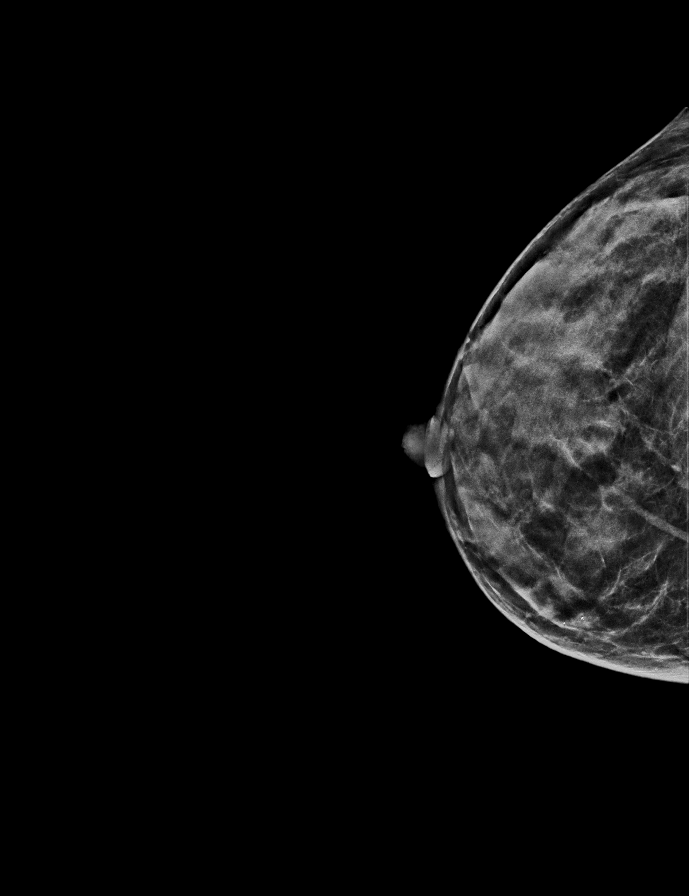

[L MLO synth-2D]
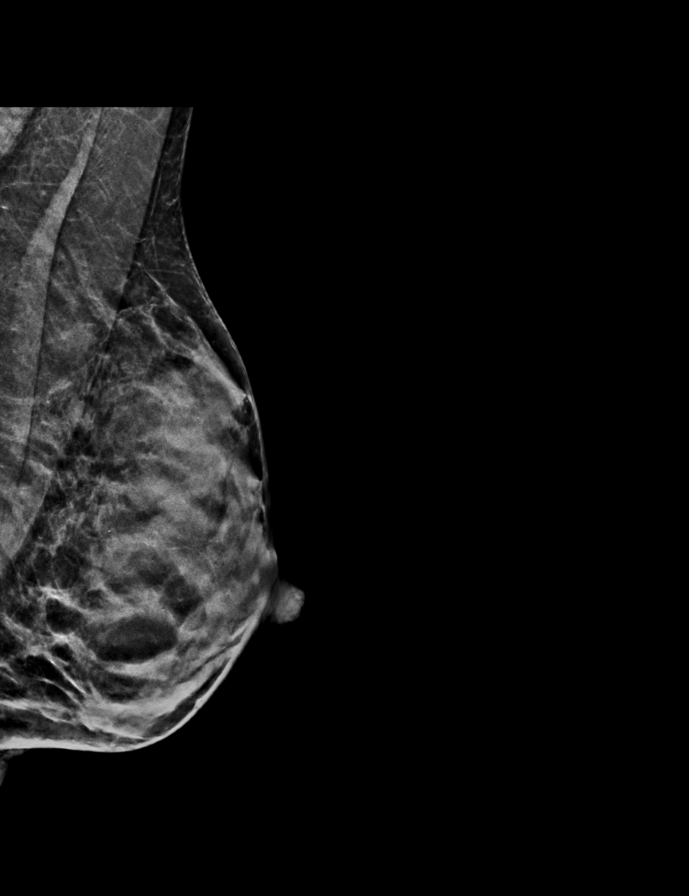

[L MLO (2 of 2)]
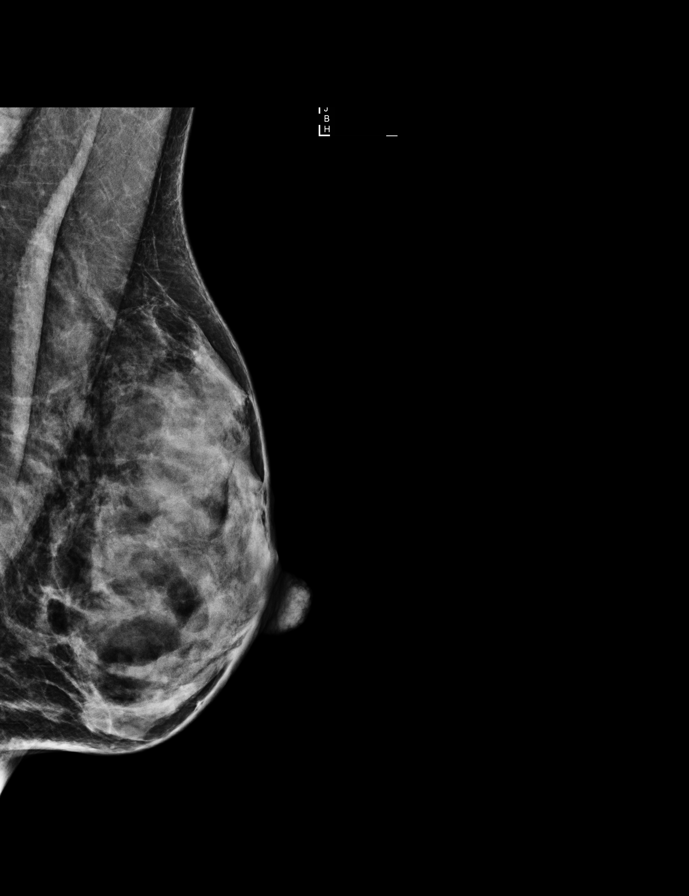

[R MLO synth-2D]
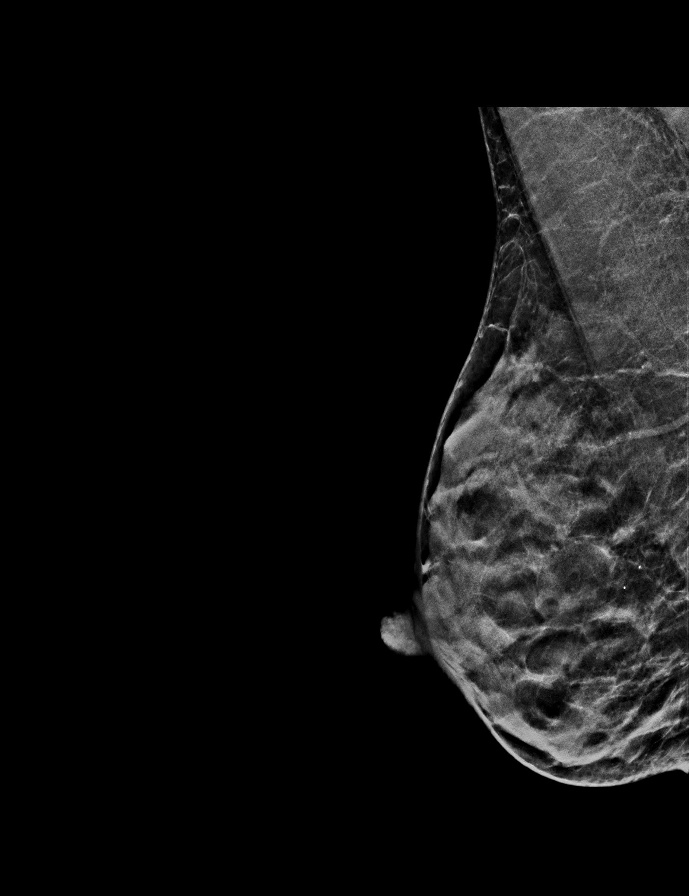

[R CC]
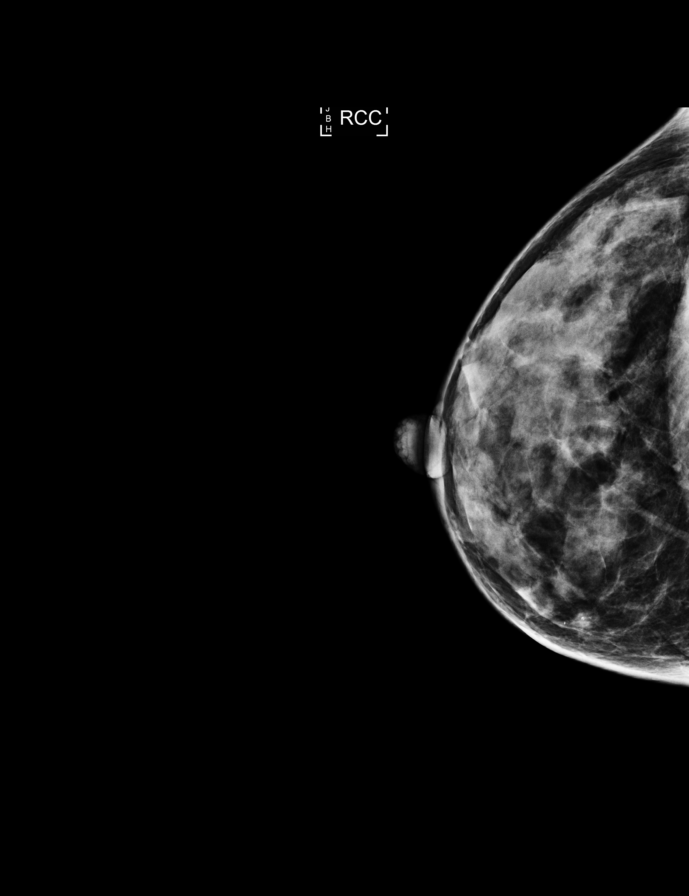

[L CC synth-2D]
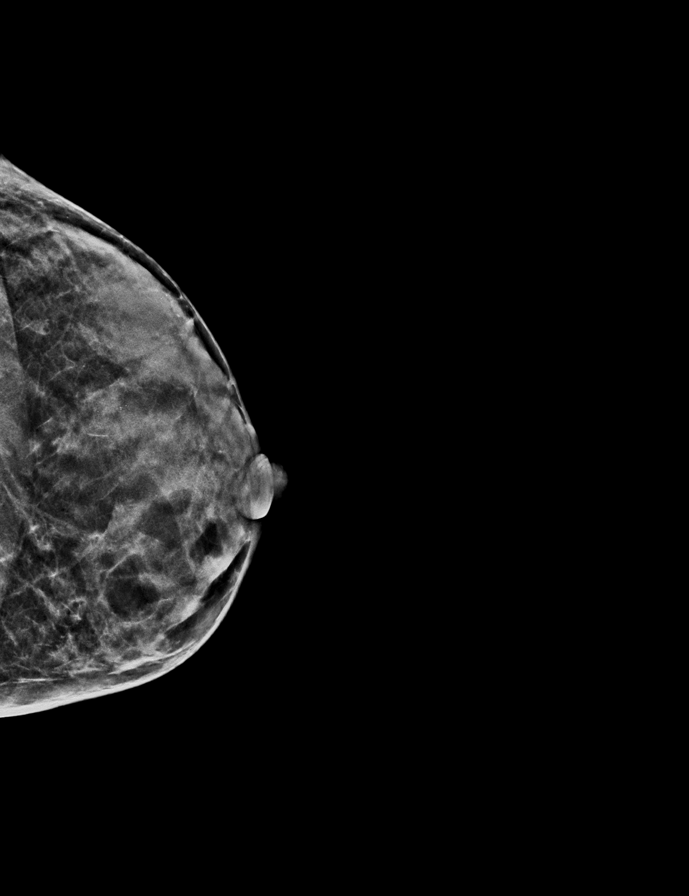

[R MLO]
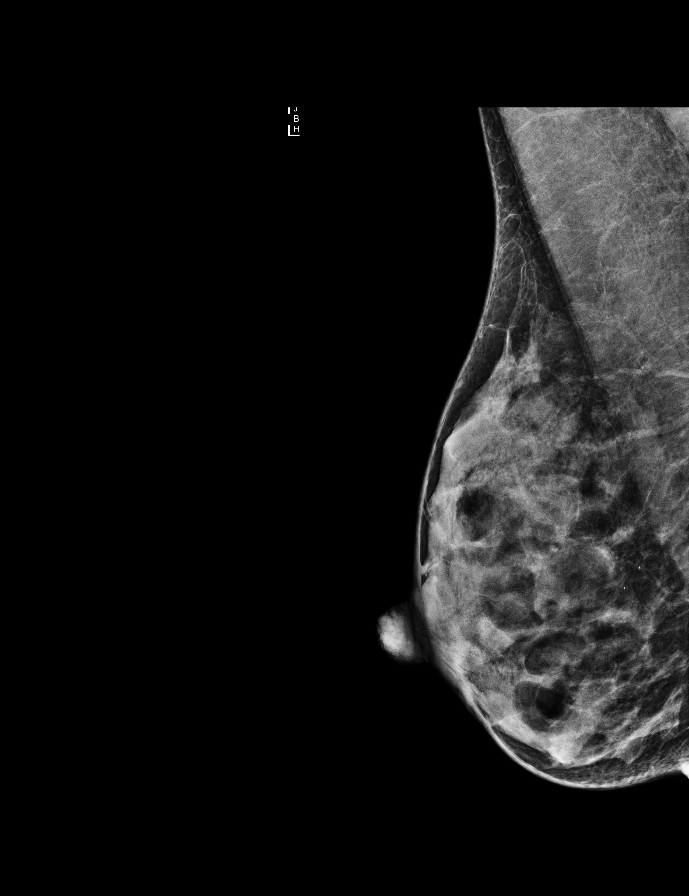

[8 of 29 positions shown; findings below may reference images not displayed]

ACR Breast Density Category d: The breast tissue is extremely dense,
which lowers the sensitivity of mammography.
FINDINGS: The oval mass in the upper-outer posterior left breast is
mammographically stable. No suspicious calcifications, masses or
areas of distortion are seen in the bilateral breasts.

Mammographic images were processed with CAD.

Ultrasound targeted to the left breast mass at [DATE], 4 cm from the
nipple demonstrates a stable mass measuring 1.0 x 0.4 x 1.0 cm,
previously measuring 0.9 x 0.5 x 0.7 cm.
IMPRESSION: 1.  The probably benign left breast mass at [DATE] is overall stable.

2.  No mammographic evidence of malignancy in the bilateral breasts.

RECOMMENDATION:
Ultrasound follow-up versus biopsy was again discussed with the
patient. She is again leading toward biopsy rather than follow-up
which is a reasonable plan. If biopsy is not performed, a 12 month
follow-up bilateral diagnostic mammogram and left breast ultrasound
would be recommended.

I have discussed the findings and recommendations with the patient.
Results were also provided in writing at the conclusion of the
visit. If applicable, a reminder letter will be sent to the patient
regarding the next appointment.

BI-RADS CATEGORY  3: Probably benign.

## 2019-03-18 ENCOUNTER — Other Ambulatory Visit: Payer: Self-pay | Admitting: Obstetrics and Gynecology

## 2019-03-18 DIAGNOSIS — F419 Anxiety disorder, unspecified: Secondary | ICD-10-CM

## 2019-03-19 MED ORDER — LORAZEPAM 0.5 MG PO TABS: ORAL_TABLET | ORAL | 0 refills | Status: DC

## 2019-03-19 NOTE — Telephone Encounter (Signed)
Please advise 

## 2019-03-23 IMAGING — US ULTRASOUND LEFT BREAST LIMITED
1 series · 5 of 5 positions shown · non-contrast
Comparison: Previous exam(s).

CLINICAL DATA: 41-year-old female presenting for follow-up of a
probably benign mass in the left breast.

EXAM:
DIGITAL DIAGNOSTIC BILATERAL MAMMOGRAM WITH CAD AND TOMO
LEFT BREAST ULTRASOUND

[Series 1: ultrasound left breast limited · 0.04mm/px · 5 of 5 slices shown]
[im 1/5]
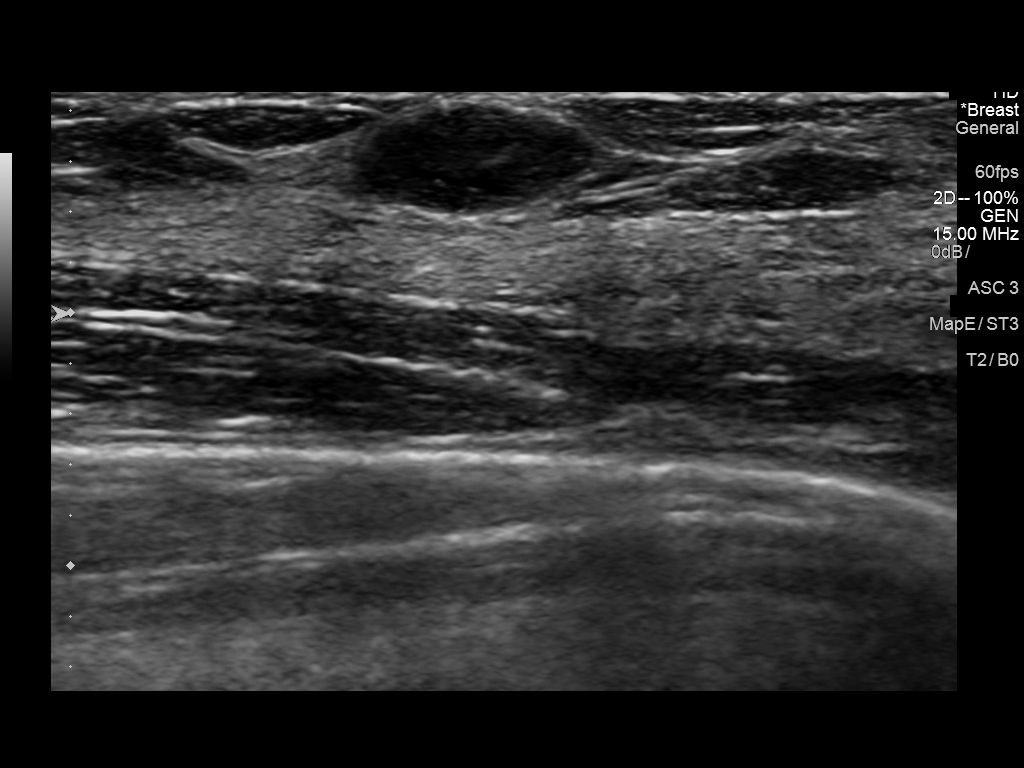
[im 2/5]
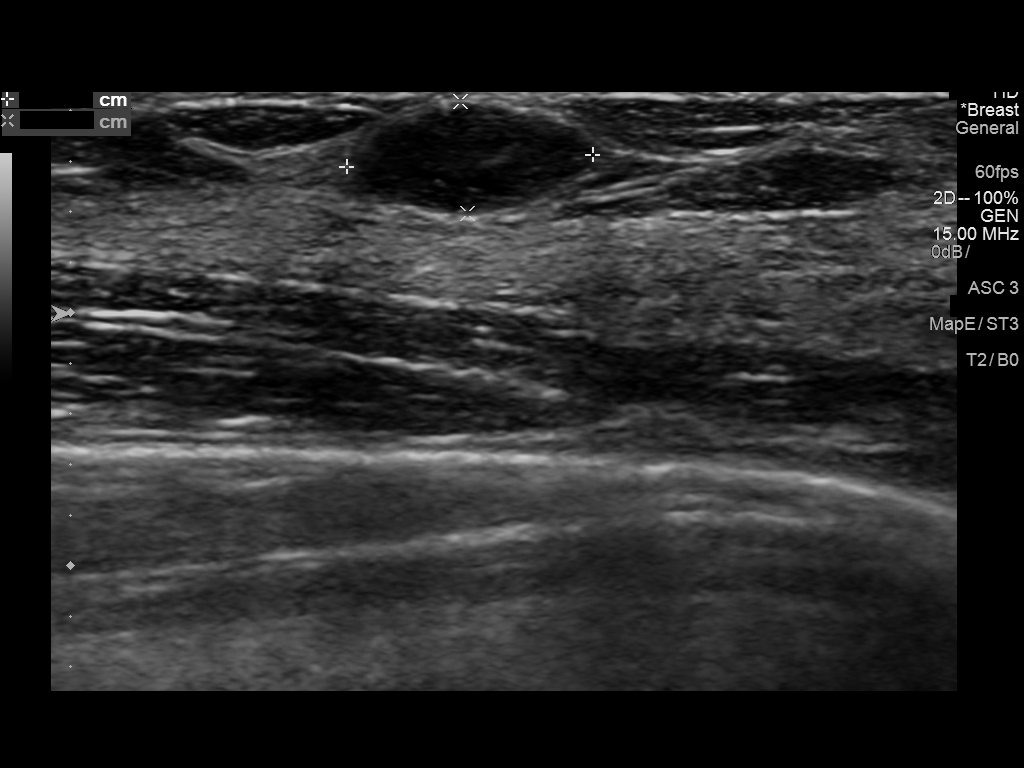
[im 3/5]
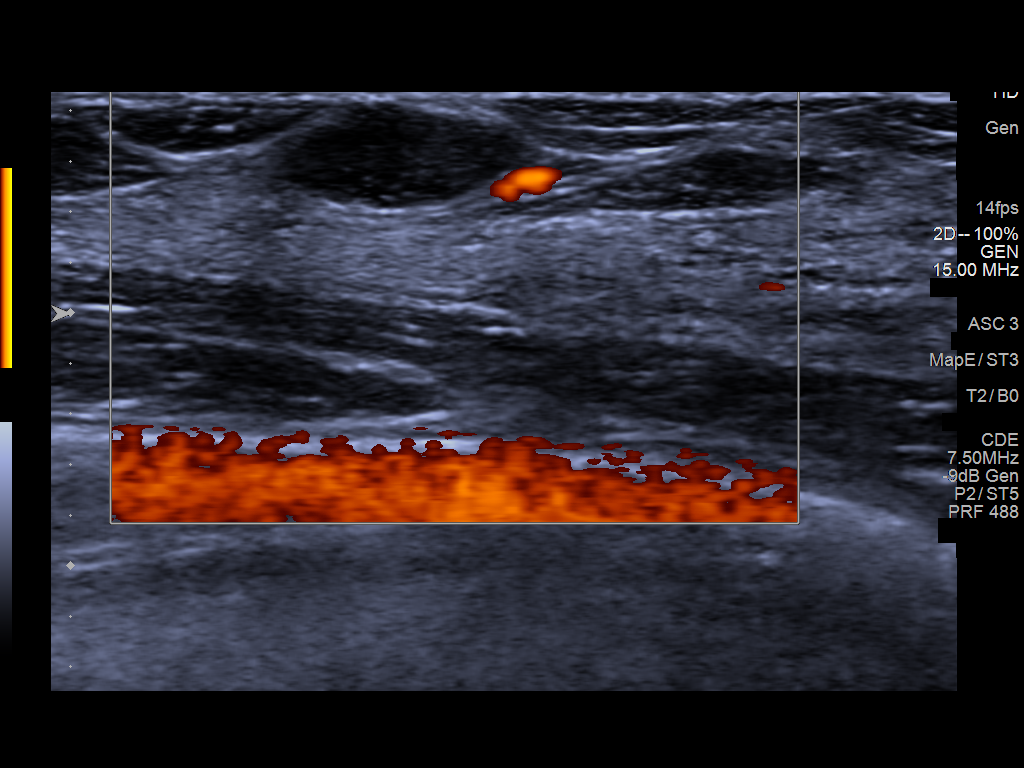
[im 4/5]
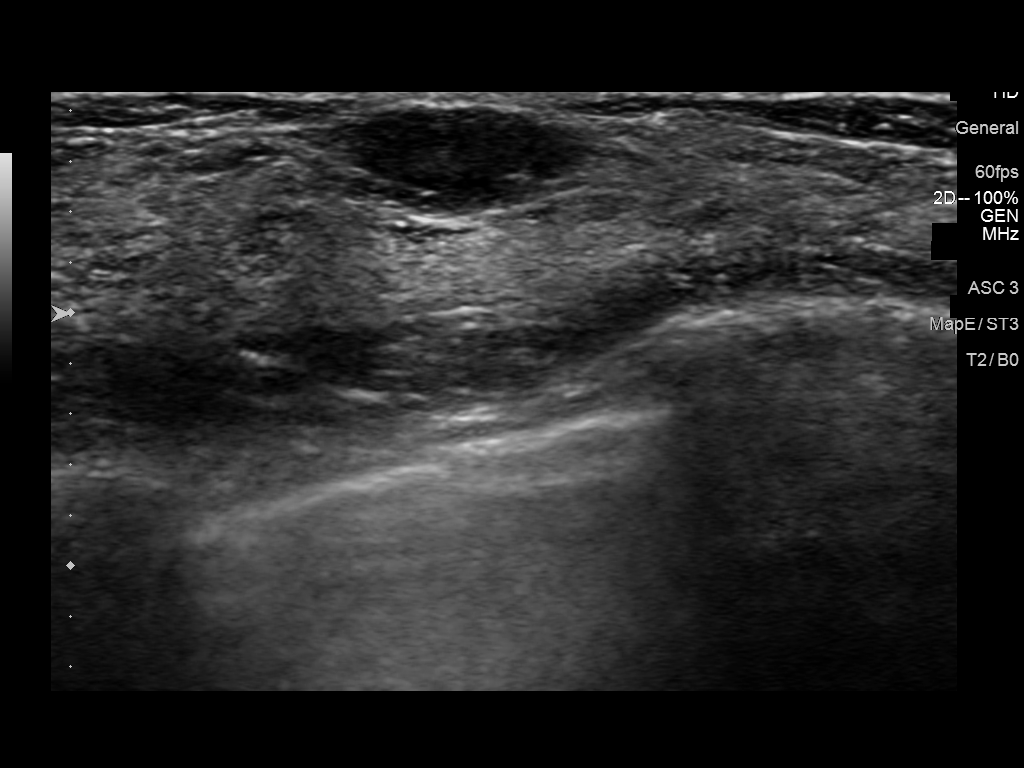
[im 5/5]
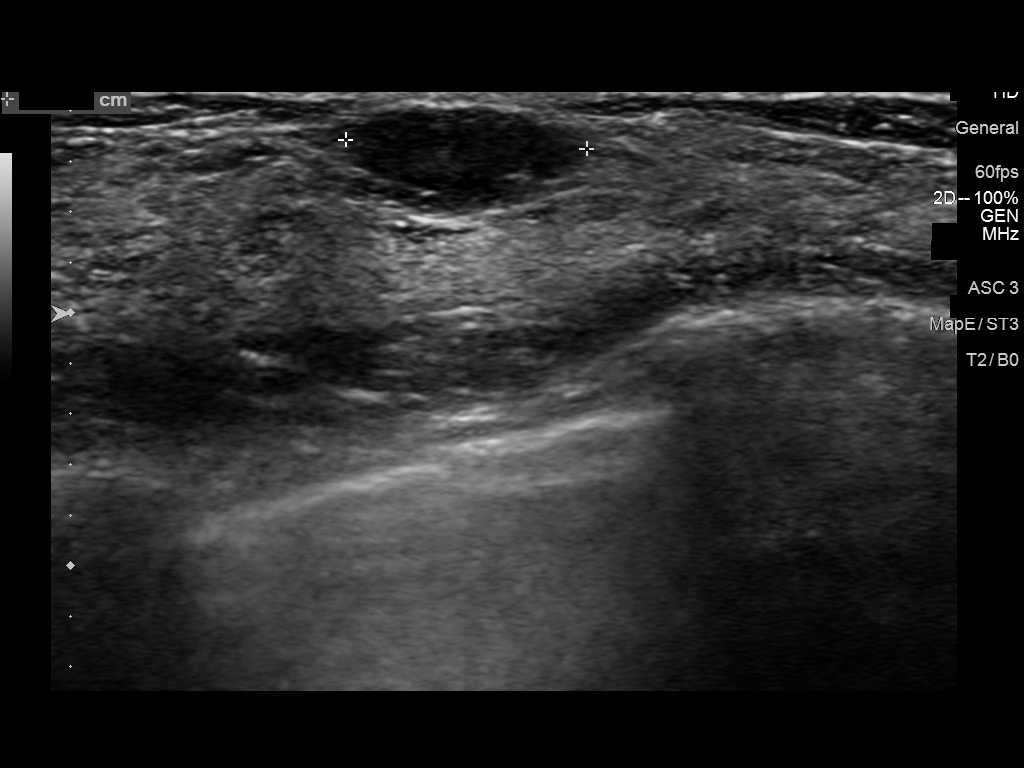

[5 of 5 positions shown; findings below may reference images not displayed]

ACR Breast Density Category d: The breast tissue is extremely dense,
which lowers the sensitivity of mammography.
FINDINGS: The oval mass in the upper-outer posterior left breast is
mammographically stable. No suspicious calcifications, masses or
areas of distortion are seen in the bilateral breasts.

Mammographic images were processed with CAD.

Ultrasound targeted to the left breast mass at [DATE], 4 cm from the
nipple demonstrates a stable mass measuring 1.0 x 0.4 x 1.0 cm,
previously measuring 0.9 x 0.5 x 0.7 cm.
IMPRESSION: 1.  The probably benign left breast mass at [DATE] is overall stable.

2.  No mammographic evidence of malignancy in the bilateral breasts.

RECOMMENDATION:
Ultrasound follow-up versus biopsy was again discussed with the
patient. She is again leading toward biopsy rather than follow-up
which is a reasonable plan. If biopsy is not performed, a 12 month
follow-up bilateral diagnostic mammogram and left breast ultrasound
would be recommended.

I have discussed the findings and recommendations with the patient.
Results were also provided in writing at the conclusion of the
visit. If applicable, a reminder letter will be sent to the patient
regarding the next appointment.

BI-RADS CATEGORY  3: Probably benign.

## 2019-04-10 ENCOUNTER — Encounter: Payer: Self-pay | Admitting: Obstetrics & Gynecology

## 2019-04-10 NOTE — Telephone Encounter (Signed)
[facility] [place] and follow up appt [place]

## 2019-04-28 ENCOUNTER — Telehealth: Payer: Self-pay | Admitting: Obstetrics & Gynecology

## 2019-04-28 ENCOUNTER — Other Ambulatory Visit: Payer: Self-pay | Admitting: Obstetrics & Gynecology

## 2019-04-28 DIAGNOSIS — N939 Abnormal uterine and vaginal bleeding, unspecified: Secondary | ICD-10-CM

## 2019-04-28 NOTE — Telephone Encounter (Signed)
Called and left voice mail for patient to call back to be schedule °

## 2019-04-28 NOTE — Telephone Encounter (Signed)
Per [facility] [place] and follow up . Called and left voicemail for patient to call back to be schedule

## 2019-04-28 NOTE — Telephone Encounter (Signed)
Patient is schedule 05/18/19

## 2019-04-28 NOTE — Telephone Encounter (Signed)
Patient is calling in to schedule an ultrasound and follow up for abnormal bleeding. Patient aware [facility] out of the office until Monday, November 2. Please order ultrasound so I may get her schedule. Thank you!

## 2019-05-18 ENCOUNTER — Encounter: Payer: Self-pay | Admitting: Obstetrics & Gynecology

## 2019-05-18 ENCOUNTER — Ambulatory Visit (INDEPENDENT_AMBULATORY_CARE_PROVIDER_SITE_OTHER): Payer: Managed Care, Other (non HMO) | Admitting: Obstetrics & Gynecology

## 2019-05-18 ENCOUNTER — Ambulatory Visit (INDEPENDENT_AMBULATORY_CARE_PROVIDER_SITE_OTHER): Payer: Managed Care, Other (non HMO)

## 2019-05-18 ENCOUNTER — Other Ambulatory Visit: Payer: Self-pay

## 2019-05-18 VITALS — BP 120/70 | Ht 64.0 in | Wt 137.0 lb

## 2019-05-18 DIAGNOSIS — N939 Abnormal uterine and vaginal bleeding, unspecified: Secondary | ICD-10-CM

## 2019-05-18 DIAGNOSIS — N83202 Unspecified ovarian cyst, left side: Secondary | ICD-10-CM

## 2019-05-18 NOTE — Progress Notes (Signed)
HPI: Pt continues w monthly vaginal bleeding sometimes to the level of period like bleeding and has used tampons to help and to see where blood is coming from, she had a TLH (hysterectomy w removal of cervix) last year.  No s/sx menopause.  No pain.  Ultrasound demonstrates no masses seen, cyst seen (see below) These findings are not revealing any mass or source for vaginal bleeding.  PMHx: She  has a past medical history of Abnormal mammogram of left breast (09/2016), Anxiety, Bacterial vaginosis, Degenerative disc disease, cervical, Epstein Barr infection, GERD (gastroesophageal reflux disease), Menometrorrhagia, Motion sickness, and Pelvic mass (07/2017). Also,  has a past surgical history that includes Wisdom tooth extraction; Colonoscopy with propofol (N/A, 11/30/2016); Head & neck skin lesion excisional biopsy (Left, 1989); Laparoscopic hysterectomy (N/A, 09/26/2017); Cystoscopy (N/A, 09/26/2017); Esophagogastroduodenoscopy (egd) with propofol (N/A, 01/10/2018); and Breast biopsy (Left, 11/22/2017)., family history includes Hyperlipidemia in her father; Melanoma in her maternal grandmother; Melanoma (age of onset: 90) in her mother; Pancreatic cancer (age of onset: 11) in her maternal grandmother.,  reports that she quit smoking about 6 years ago. Her smoking use included cigarettes. She has a 5.00 pack-year smoking history. She has never used smokeless tobacco. She reports current alcohol use of about 2.0 standard drinks of alcohol per week. She reports that she does not use drugs.  She has a current medication list which includes the following prescription(s): bupropion, cetirizine, lorazepam, loratadine, and melatonin. Also, is allergic to doxycycline.  Review of Systems  All other systems reviewed and are negative.   Objective: BP 120/70   Ht 5\' 4"  (1.626 m)   Wt 137 lb (62.1 kg)   LMP 08/26/2017   BMI 23.52 kg/m  Physical Exam Constitutional:      General: She is not in acute  distress.    Appearance: She is well-developed.  Genitourinary:     Pelvic exam was performed with patient supine.     Vagina normal.     No vaginal erythema or bleeding.     Genitourinary Comments: Cuff intact/ no lesions    No areas of bleeding, granulation tissue    Mild ly tender when aliss clamp used to grasp apex Absent uterus and cervix  HENT:     Head: Normocephalic and atraumatic.     Nose: Nose normal.  Abdominal:     General: There is no distension.     Palpations: Abdomen is soft.     Tenderness: There is no abdominal tenderness.  Musculoskeletal: Normal range of motion.  Neurological:     Mental Status: She is alert and oriented to person, place, and time.     Cranial Nerves: No cranial nerve deficit.  Skin:    General: Skin is warm and dry.  Psychiatric:        Attention and Perception: Attention normal.        Mood and Affect: Mood normal.        Speech: Speech normal.        Behavior: Behavior normal.        Cognition and Memory: Cognition normal.        Judgment: Judgment normal.     US Pelvic Complete With Transvaginal  Result Date: 05/18/2019 Patient Name: Bethany Evans DOB: [birth year] MRN: [id number] ULTRASOUND REPORT Location: [facility] Date of Service: 05/18/2019 Indications: Abnormal vaginal bleeding Findings: The uterus is absent. Right Ovary measures 3.9 x 2.9 x 3.3 cm. It is normal in appearance. Left Ovary measures 3.0 x 1.6  x 1.4 cm. It is normal in appearance. There are two small simple cysts in the left ovary measuring, 29 x 14 x 29 mm and 26 x 15 x 21 mm. Survey of the adnexa demonstrates no adnexal masses. Trace anechoic free fluid in midline pelvis. Impression: 1. The uterus and cervix are absent. 2. Normal right ovary. There are two small simple cysts in the left ovary. Recommendations: 1.Clinical correlation with the patient's History and Physical Exam. Gweneth Dimitri, RT Review of ULTRASOUND.    I have personally reviewed images and  report of recent ultrasound done at [facility]    Plan of management to be discussed with patient. Barnett Applebaum, MD, [facility], [facility] 05/18/2019  11:02 AM   Assessment:  Abnormal vaginal bleeding    Options for vaginal apex scar revision as only option    No area to cryo, cauterize, etc    Concerns over cyclical bleeding discussed, low risk for cancer    Plans to monitor for now as does not want a surgery Ovarian cyst    No sx's.  Monitor as needed  Annual due Feb 2021     A total of 15 minutes were spent face-to-face with the patient during this encounter and over half of that time dealt with counseling and coordination of care.  Barnett Applebaum, MD, [facility], [facility] 05/18/2019  11:13 AM

## 2020-05-10 NOTE — Progress Notes (Signed)
PCP:  Chad Cordial, PA-C   Chief Complaint  Patient presents with  . Annual Exam     HPI:      [name] is a 44 y.o. No obstetric history on file. who LMP was Patient's last menstrual period was 08/26/2017., presents today for her annual examination.  Her menses are absent due to total lap hyst with bilat salpingectomy due to large leio 3/19 with Dr. Kenton Kingfisher. She had a few episodes of light bleeding that resolved. No vag dryness/pain.  Sex activity: has sex with females. No vag pain. Last Pap: 07/18/17  Results were: no abnormalities /neg HPV DNA ; no longer indicated Hx of STDs: none  Last mammogram: 10/16/17 Cat 3 due to stable LT breast mass. Pt had breast bx 5/19 so she didn't need to continue u/s f/u; bx showed fibroadenoma. Pt had cat 2 mammo 11/19 LT breast. Due for scr mammo bilat 4/20 but never done. Pt wants to wait till age 79 for screening mammos.   There is no FH of breast cancer. There is no FH of ovarian cancer. There is a FH of pancreatic cancer in her MGM and melanoma in her mom and MGM, genetic testing not done. Both are deceased. The patient does not do self-breast exams.   Tobacco use: The patient denies current or previous tobacco use. Alcohol use: none No drug use.  Exercise: not active  She does get adequate calcium but not Vitamin D in her diet.  She takes ativan sparingly prn anxiety and needs a RF.  Has PCP now and doing labs through her. PCP also managing wellbutrin Rx.   Past Medical History:  Diagnosis Date  . Abnormal mammogram of left breast 09/2016  . Anxiety   . Bacterial vaginosis   . Degenerative disc disease, cervical    no issues currently  . [facility] Patient infection   . GERD (gastroesophageal reflux disease)   . Menometrorrhagia   . Motion sickness    boats  . Pelvic mass 07/2017    Past Surgical History:  Procedure Laterality Date  . BREAST BIOPSY Left 11/22/2017   US guided biopsy, FIBROEPITHELIAL LESION, MOST  CONSISTENT WITH FIBROADENOMA  . COLONOSCOPY WITH PROPOFOL N/A 11/30/2016   Procedure: COLONOSCOPY WITH PROPOFOL;  Surgeon: Lucilla Lame, MD;  Location: [facility];  Service: Endoscopy;  Laterality: N/A; repeat 10 yrs  . CYSTOSCOPY N/A 09/26/2017   Procedure: CYSTOSCOPY;  Surgeon: Gae Dry, MD;  Location: [facility];  Service: Gynecology;  Laterality: N/A;  . ESOPHAGOGASTRODUODENOSCOPY (EGD) WITH PROPOFOL N/A 01/10/2018   Procedure: ESOPHAGOGASTRODUODENOSCOPY (EGD) WITH biopsy;  Surgeon: Lucilla Lame, MD;  Location: [facility];  Service: Endoscopy;  Laterality: N/A;  . HEAD & NECK SKIN LESION EXCISIONAL BIOPSY Left 1989   Face Benign  . LAPAROSCOPIC HYSTERECTOMY N/A 09/26/2017   Procedure: HYSTERECTOMY TOTAL LAPAROSCOPIC BILATERAL SALPINGECTOMY;  Surgeon: Gae Dry, MD;  Location: [facility];  Service: Gynecology;  Laterality: N/A;  . WISDOM TOOTH EXTRACTION     all 4 were impacted    Family History  Problem Relation Age of Onset  . Hyperlipidemia Father   . Melanoma Maternal Grandmother   . Pancreatic cancer Maternal Grandmother 60  . Melanoma Mother 50  . Breast cancer Neg Hx     Social History   Socioeconomic History  . Marital status: Single    Spouse name: Not on file  . Number of children: Not on file  . Years of education: Not  on file  . Highest education level: Not on file  Occupational History  . Not on file  Tobacco Use  . Smoking status: Former Smoker    Packs/day: 0.50    Years: 10.00    Pack years: 5.00    Types: Cigarettes    Quit date: 07/02/2012    Years since quitting: 7.8  . Smokeless tobacco: Never Used  Vaping Use  . Vaping Use: Never used  Substance and Sexual Activity  . Alcohol use: Yes    Alcohol/week: 2.0 standard drinks    Types: 1 Glasses of wine, 1 Shots of liquor per week    Comment: Occasionally  . Drug use: No  . Sexual activity: Yes    Birth control/protection: Surgical    Comment: Hysterectomy  Other Topics  Concern  . Not on file  Social History Narrative  . Not on file   Social Determinants of Health   Financial Resource Strain:   . Difficulty of Paying Living Expenses: Not on file  Food Insecurity:   . Worried About Charity fundraiser in the Last Year: Not on file  . Ran Out of Food in the Last Year: Not on file  Transportation Needs:   . Lack of Transportation (Medical): Not on file  . Lack of Transportation (Non-Medical): Not on file  Physical Activity:   . Days of Exercise per Week: Not on file  . Minutes of Exercise per Session: Not on file  Stress:   . Feeling of Stress : Not on file  Social Connections:   . Frequency of Communication with Friends and Family: Not on file  . Frequency of Social Gatherings with Friends and Family: Not on file  . Attends Religious Services: Not on file  . Active Member of Clubs or Organizations: Not on file  . Attends Archivist Meetings: Not on file  . Marital Status: Not on file  Intimate Partner Violence:   . Fear of Current or Ex-Partner: Not on file  . Emotionally Abused: Not on file  . Physically Abused: Not on file  . Sexually Abused: Not on file    Current Meds  Medication Sig  . LORazepam (ATIVAN) 0.5 MG tablet TAKE 1 TABLET BY MOUTH AS NEEDED SYMPTOMS  . [DISCONTINUED] LORazepam (ATIVAN) 0.5 MG tablet TAKE 1 TABLET BY MOUTH AS NEEDED SYMPTOMS     ROS:  Review of Systems  Constitutional: Negative for fatigue, fever and unexpected weight change.  Respiratory: Negative for cough, shortness of breath and wheezing.   Cardiovascular: Negative for chest pain, palpitations and leg swelling.  Gastrointestinal: Negative for blood in stool, constipation, diarrhea, nausea and vomiting.  Endocrine: Negative for cold intolerance, heat intolerance and polyuria.  Genitourinary: Negative for dyspareunia, dysuria, flank pain, frequency, genital sores, hematuria, menstrual problem, pelvic pain, urgency, vaginal bleeding, vaginal  discharge and vaginal pain.  Musculoskeletal: Negative for back pain, joint swelling and myalgias.  Skin: Negative for rash.  Neurological: Negative for dizziness, syncope, light-headedness, numbness and headaches.  Hematological: Negative for adenopathy.  Psychiatric/Behavioral: Positive for agitation. Negative for confusion, sleep disturbance and suicidal ideas. The patient is not nervous/anxious.      Objective: BP 126/74   Ht 5\' 4"  (1.626 m)   Wt 136 lb (61.7 kg)   LMP 08/26/2017   BMI 23.34 kg/m    Physical Exam Constitutional:      Appearance: She is well-developed.  Genitourinary:     Vulva, vagina, right adnexa and left adnexa normal.  No lesions in the vagina.     No vaginal discharge, erythema, tenderness or bleeding.     Cervix is absent.     Uterus is absent.     No right or left adnexal mass present.     Right adnexa not tender.     Left adnexa not tender.     Genitourinary Comments: UTERUS/CX SURG REM  Neck:     Thyroid: No thyromegaly.  Cardiovascular:     Rate and Rhythm: Normal rate and regular rhythm.     Heart sounds: Normal heart sounds. No murmur heard.   Pulmonary:     Effort: Pulmonary effort is normal.     Breath sounds: Normal breath sounds.  Chest:     Breasts:        Right: No mass, nipple discharge, skin change or tenderness.        Left: No mass, nipple discharge, skin change or tenderness.  Abdominal:     Palpations: Abdomen is soft.     Tenderness: There is no abdominal tenderness. There is no guarding.  Musculoskeletal:        General: Normal range of motion.     Cervical back: Normal range of motion.  Neurological:     General: No focal deficit present.     Mental Status: She is alert and oriented to person, place, and time.     Cranial Nerves: No cranial nerve deficit.  Skin:    General: Skin is warm and dry.  Psychiatric:        Mood and Affect: Mood normal.        Behavior: Behavior normal.        Thought Content:  Thought content normal.        Judgment: Judgment normal.  Vitals reviewed.     Assessment/Plan: Encounter for annual routine gynecological examination  Encounter for screening mammogram for malignant neoplasm of breast - Plan: MM 3D SCREEN BREAST BILATERAL; order placed, pt declines till age 70  Anxiety - Plan: LORazepam (ATIVAN) 0.5 MG tablet; Rx RF. Pt takes sparingly.  Family history of pancreatic cancer--MyRisk testing discussed and handout given. Pt declines for now. F/u prn.   Family history of melanoma--Pt seeing derm for mole checks.   Meds ordered this encounter  Medications  . LORazepam (ATIVAN) 0.5 MG tablet    Sig: TAKE 1 TABLET BY MOUTH AS NEEDED SYMPTOMS    Dispense:  30 tablet    Refill:  0    Order Specific Question:   Supervising Provider    Answer:   Gae Dry [[id number]]             GYN counsel breast self exam, mammography screening, adequate intake of calcium and vitamin D, diet and exercise     F/U  Return in about 1 year (around 05/11/2021).  Bethany Huyett B. Veasna Santibanez, PA-C 05/11/2020 10:33 AM

## 2020-05-11 ENCOUNTER — Ambulatory Visit (INDEPENDENT_AMBULATORY_CARE_PROVIDER_SITE_OTHER): Payer: Managed Care, Other (non HMO) | Admitting: Obstetrics and Gynecology

## 2020-05-11 ENCOUNTER — Encounter: Payer: Self-pay | Admitting: Obstetrics and Gynecology

## 2020-05-11 ENCOUNTER — Other Ambulatory Visit: Payer: Self-pay

## 2020-05-11 VITALS — BP 126/74 | Ht 64.0 in | Wt 136.0 lb

## 2020-05-11 DIAGNOSIS — Z1231 Encounter for screening mammogram for malignant neoplasm of breast: Secondary | ICD-10-CM | POA: Diagnosis not present

## 2020-05-11 DIAGNOSIS — Z800 Family history of malignant neoplasm of digestive organs: Secondary | ICD-10-CM | POA: Insufficient documentation

## 2020-05-11 DIAGNOSIS — Z01419 Encounter for gynecological examination (general) (routine) without abnormal findings: Secondary | ICD-10-CM

## 2020-05-11 DIAGNOSIS — Z808 Family history of malignant neoplasm of other organs or systems: Secondary | ICD-10-CM

## 2020-05-11 DIAGNOSIS — F419 Anxiety disorder, unspecified: Secondary | ICD-10-CM | POA: Diagnosis not present

## 2020-05-11 MED ORDER — LORAZEPAM 0.5 MG PO TABS: ORAL_TABLET | ORAL | 0 refills | Status: DC

## 2020-05-11 NOTE — Patient Instructions (Signed)
I value your feedback and entrusting us with your care. If you get a [facility] patient survey, I would appreciate you taking the time to let us know about your experience today. Thank you! ° °As of June 11, 2019, your lab results will be released to your MyChart immediately, before I even have a chance to see them. Please give me time to review them and contact you if there are any abnormalities. Thank you for your patience.  ° °[facility]: [phone] ° ° ° °

## 2020-12-22 ENCOUNTER — Ambulatory Visit
Admission: RE | Admit: 2020-12-22 | Discharge: 2020-12-22 | Disposition: A | Discharge status: Home / Self Care | Payer: Managed Care, Other (non HMO) | Source: Ambulatory Visit | Attending: Obstetrics and Gynecology | Admitting: Obstetrics and Gynecology

## 2020-12-22 ENCOUNTER — Other Ambulatory Visit: Payer: Self-pay

## 2020-12-22 DIAGNOSIS — Z1231 Encounter for screening mammogram for malignant neoplasm of breast: Secondary | ICD-10-CM | POA: Diagnosis present

## 2021-02-22 ENCOUNTER — Encounter: Payer: Self-pay | Admitting: Obstetrics and Gynecology

## 2021-02-22 ENCOUNTER — Other Ambulatory Visit: Payer: Self-pay | Admitting: Obstetrics and Gynecology

## 2021-02-22 DIAGNOSIS — F419 Anxiety disorder, unspecified: Secondary | ICD-10-CM

## 2021-02-22 MED ORDER — LORAZEPAM 0.5 MG PO TABS: ORAL_TABLET | ORAL | 0 refills | Status: DC

## 2021-02-22 NOTE — Progress Notes (Signed)
Rx RF ativan prn.

## 2021-05-16 NOTE — Progress Notes (Signed)
PCP:  Chad Cordial, PA-C   Chief Complaint  Patient presents with   Gynecologic Exam    Hormone qs     HPI:      Bethany Evans is a 45 y.o. No obstetric history on file. who LMP was Patient's last menstrual period was 08/26/2017., presents today for her annual examination.  Her menses are absent due to total lap hyst with bilat salpingectomy due to large leio 3/19 with Dr. Kenton Kingfisher. She had a few episodes of light bleeding a couple yrs ago that resolved; none since. No vag dryness/pain.  Sex activity: has sex with females. No vag pain. Last Pap: 07/18/17  Results were: no abnormalities /neg HPV DNA ; no longer indicated Hx of STDs: none  Last mammogram: 12/22/20 Results were normal, repeat in 12 months. 2019 with  Cat 3 due to stable LT breast mass. Pt had breast bx 5/19 so she didn't need to continue u/s f/u; bx showed fibroadenoma. Pt had cat 2 mammo 11/19 LT breast.  Pt wants to wait till age 58 for screening mammos.    There is no FH of breast cancer. There is no FH of ovarian cancer. There is a FH of pancreatic cancer in her MGM and melanoma in her mom and MGM, genetic testing not done and pt doesn't meet current guidelines. Both are deceased. The patient does not do self-breast exams.   Tobacco use: The patient denies current or previous tobacco use. Alcohol use: none No drug use.  Exercise: mod active  Colonoscopy: 2018 with Dr. Allen Norris; neg per pt. Can wait till age 74 for repeat.   She does get adequate calcium but not Vitamin D in her diet.  She takes ativan sparingly prn anxiety, last RF 8/22. Stopped wellbutrin for now.  Has PCP now and doing labs through her. PCP also managing wellbutrin Rx.   Pt sees derm regularly for mole checks due to [place] melanoma. Pt has noticed hair loss with thin spots. Saw derm, no labs done; thought due to trauma/stress. Has FH hair thinning in female cousin. Isn't taking hair/nails supp.   Past Medical History:  Diagnosis Date    Abnormal mammogram of left breast 09/2016   Anxiety    Bacterial vaginosis    Degenerative disc disease, cervical    no issues currently   Epstein Barr infection    GERD (gastroesophageal reflux disease)    Menometrorrhagia    Motion sickness    boats   Pelvic mass 07/2017    Past Surgical History:  Procedure Laterality Date   BREAST BIOPSY Left 11/22/2017   US guided biopsy, FIBROEPITHELIAL LESION, MOST CONSISTENT WITH FIBROADENOMA   COLONOSCOPY WITH PROPOFOL N/A 11/30/2016   Procedure: COLONOSCOPY WITH PROPOFOL;  Surgeon: Lucilla Lame, MD;  Location: [facility];  Service: Endoscopy;  Laterality: N/A; repeat 10 yrs   CYSTOSCOPY N/A 09/26/2017   Procedure: CYSTOSCOPY;  Surgeon: Gae Dry, MD;  Location: [facility];  Service: Gynecology;  Laterality: N/A;   ESOPHAGOGASTRODUODENOSCOPY (EGD) WITH PROPOFOL N/A 01/10/2018   Procedure: ESOPHAGOGASTRODUODENOSCOPY (EGD) WITH biopsy;  Surgeon: Lucilla Lame, MD;  Location: [facility];  Service: Endoscopy;  Laterality: N/A;   HEAD & NECK SKIN LESION EXCISIONAL BIOPSY Left 1989   Face Benign   LAPAROSCOPIC HYSTERECTOMY N/A 09/26/2017   Procedure: HYSTERECTOMY TOTAL LAPAROSCOPIC BILATERAL SALPINGECTOMY;  Surgeon: Gae Dry, MD;  Location: [facility];  Service: Gynecology;  Laterality: N/A;   WISDOM TOOTH EXTRACTION  all 4 were impacted    Family History  Problem Relation Age of Onset   Hyperlipidemia Father    Melanoma Maternal Grandmother    Pancreatic cancer Maternal Grandmother 23   Melanoma Mother 63   Breast cancer Neg Hx     Social History   Socioeconomic History   Marital status: Single    Spouse name: Not on file   Number of children: Not on file   Years of education: Not on file   Highest education level: Not on file  Occupational History   Not on file  Tobacco Use   Smoking status: Former    Packs/day: 0.50    Years: 10.00    Pack years: 5.00    Types: Cigarettes    Quit date: 07/02/2012     Years since quitting: 8.8   Smokeless tobacco: Never  Vaping Use   Vaping Use: Never used  Substance and Sexual Activity   Alcohol use: Yes    Alcohol/week: 2.0 standard drinks    Types: 1 Glasses of wine, 1 Shots of liquor per week    Comment: Occasionally   Drug use: No   Sexual activity: Yes    Birth control/protection: Surgical    Comment: Hysterectomy  Other Topics Concern   Not on file  Social History Narrative   Not on file   Social Determinants of Health   Financial Resource Strain: Not on file  Food Insecurity: Not on file  Transportation Needs: Not on file  Physical Activity: Not on file  Stress: Not on file  Social Connections: Not on file  Intimate Partner Violence: Not on file    Current Meds  Medication Sig   LORazepam (ATIVAN) 0.5 MG tablet TAKE 1 TABLET BY MOUTH AS NEEDED SYMPTOMS     ROS:  Review of Systems  Constitutional:  Negative for fatigue, fever and unexpected weight change.  Respiratory:  Negative for cough, shortness of breath and wheezing.   Cardiovascular:  Negative for chest pain, palpitations and leg swelling.  Gastrointestinal:  Negative for blood in stool, constipation, diarrhea, nausea and vomiting.  Endocrine: Negative for cold intolerance, heat intolerance and polyuria.  Genitourinary:  Negative for dyspareunia, dysuria, flank pain, frequency, genital sores, hematuria, menstrual problem, pelvic pain, urgency, vaginal bleeding, vaginal discharge and vaginal pain.  Musculoskeletal:  Negative for back pain, joint swelling and myalgias.  Skin:  Negative for rash.  Neurological:  Negative for dizziness, syncope, light-headedness, numbness and headaches.  Hematological:  Negative for adenopathy.  Psychiatric/Behavioral:  Positive for agitation. Negative for confusion, sleep disturbance and suicidal ideas. The patient is not nervous/anxious.     Objective: BP 120/60   Ht 5\' 4"  (1.626 m)   Wt 133 lb (60.3 kg)   LMP 08/26/2017   BMI  22.83 kg/m    Physical Exam Constitutional:      Appearance: She is well-developed.  Genitourinary:     Vulva normal.     Genitourinary Comments: UTERUS/CX SURG REM     Right Labia: No rash, tenderness or lesions.    Left Labia: No tenderness, lesions or rash.    Vaginal cuff intact.    No vaginal discharge, erythema or tenderness.      Right Adnexa: not tender and no mass present.    Left Adnexa: not tender and no mass present.    Cervix is absent.     Uterus is absent.  Breasts:    Right: No mass, nipple discharge, skin change or tenderness.  Left: No mass, nipple discharge, skin change or tenderness.  Neck:     Thyroid: No thyromegaly.  Cardiovascular:     Rate and Rhythm: Normal rate and regular rhythm.     Heart sounds: Normal heart sounds. No murmur heard. Pulmonary:     Effort: Pulmonary effort is normal.     Breath sounds: Normal breath sounds.  Abdominal:     Palpations: Abdomen is soft.     Tenderness: There is no abdominal tenderness. There is no guarding.  Musculoskeletal:        General: Normal range of motion.     Cervical back: Normal range of motion.  Neurological:     General: No focal deficit present.     Mental Status: She is alert and oriented to person, place, and time.     Cranial Nerves: No cranial nerve deficit.  Skin:    General: Skin is warm and dry.  Psychiatric:        Mood and Affect: Mood normal.        Behavior: Behavior normal.        Thought Content: Thought content normal.        Judgment: Judgment normal.  Vitals reviewed.    Assessment/Plan: Encounter for annual routine gynecological examination  Encounter for screening mammogram for malignant neoplasm of breast; pt current on mammo. Recommend yearly.   Anxiety--will RF ativan prn.  Hair loss - Plan: TSH + free T4, Ferritin; check labs. If neg, could be stress vs genetic. Add hair/nails supp, pt sees derm.   Family history of melanoma--Pt seeing derm for mole checks.              GYN counsel breast self exam, mammography screening, adequate intake of calcium and vitamin D, diet and exercise     F/U  Return in about 1 year (around 05/17/2022).  Treyana Sturgell B. Dimple Bastyr, PA-C 05/17/2021 9:53 AM

## 2021-05-17 ENCOUNTER — Ambulatory Visit (INDEPENDENT_AMBULATORY_CARE_PROVIDER_SITE_OTHER): Payer: Managed Care, Other (non HMO) | Admitting: Obstetrics and Gynecology

## 2021-05-17 ENCOUNTER — Encounter: Payer: Self-pay | Admitting: Obstetrics and Gynecology

## 2021-05-17 ENCOUNTER — Other Ambulatory Visit: Payer: Self-pay

## 2021-05-17 VITALS — BP 120/60 | Ht 64.0 in | Wt 133.0 lb

## 2021-05-17 DIAGNOSIS — L659 Nonscarring hair loss, unspecified: Secondary | ICD-10-CM

## 2021-05-17 DIAGNOSIS — Z01419 Encounter for gynecological examination (general) (routine) without abnormal findings: Secondary | ICD-10-CM | POA: Diagnosis not present

## 2021-05-17 DIAGNOSIS — Z1231 Encounter for screening mammogram for malignant neoplasm of breast: Secondary | ICD-10-CM | POA: Diagnosis not present

## 2021-05-17 DIAGNOSIS — F419 Anxiety disorder, unspecified: Secondary | ICD-10-CM | POA: Diagnosis not present

## 2021-05-17 NOTE — Patient Instructions (Signed)
I value your feedback and you entrusting us with your care. If you get a [facility] patient survey, I would appreciate you taking the time to let us know about your experience today. Thank you! ? ? ?

## 2021-05-18 LAB — TSH+FREE T4
Free T4: 1.25 ng/dL (ref 0.82–1.77)
TSH: 1.82 u[IU]/mL (ref 0.450–4.500)

## 2021-05-18 LAB — FERRITIN: Ferritin: 141 ng/mL (ref 15–150)

## 2021-10-04 ENCOUNTER — Ambulatory Visit (LOCAL_COMMUNITY_HEALTH_CENTER): Payer: Self-pay

## 2021-10-04 DIAGNOSIS — Z719 Counseling, unspecified: Secondary | ICD-10-CM

## 2021-10-04 DIAGNOSIS — Z23 Encounter for immunization: Secondary | ICD-10-CM

## 2021-10-04 NOTE — Progress Notes (Signed)
?  Are you feeling sick today? No ? ? ?Have you ever received a dose of COVID-19 Vaccine? [employer], [place], [place], [place], Other) Yes ? ?If yes, which vaccine and how many doses?   Moderna primary series 2 doses, Moderna monovalent booster 1 dose ? ? ?Did you bring the vaccination record card or other documentation?  No ? ? ?Do you have a health condition or are undergoing treatment that makes you moderately or severely immunocompromised? This would include, but not be limited to: cancer, HIV, organ transplant, immunosuppressive therapy/high-dose corticosteroids, or moderate/severe primary immunodeficiency.  No ? ?Have you received COVID-19 vaccine before or during hematopoietic cell transplant (HCT) or CAR-T-cell therapies? No ? ?Have you ever had an allergic reaction to: (This would include a severe allergic reaction or a reaction that caused hives, swelling, or respiratory distress, including wheezing.) A component of a COVID-19 vaccine or a previous dose of COVID-19 vaccine? No ? ? ?Have you ever had an allergic reaction to another vaccine (other thanCOVID-19 vaccine) or an injectable medication? (This would include a severe allergic reaction or a reaction that caused hives, swelling, or respiratory distress, including wheezing.)   No ?  ?Do you have a history of any of the following: ? ?Myocarditis or Pericarditis No ?Thrombosis with thrombocytopenia syndrome (TTS) No ?Multisystem Inflammatory Syndrome (MIS-C or MIS-A)? No ?Immune-mediate syndrome defined by thrombosis and thrombocytopenia, such as heparin--induced thrombocytopenia (HIT)  No ?Guillain-Barr? Syndrome (GBS) No ?COVID-19 disease within the past 3 months? No ?Vaccinated with monkeypox vaccine in the last 4 weeks? No ? ?[facility] and COVID card updated and given to patient. Ranae Palms, RN ? ?

## 2022-02-06 ENCOUNTER — Other Ambulatory Visit: Payer: Self-pay | Admitting: Obstetrics and Gynecology

## 2022-02-06 ENCOUNTER — Encounter: Payer: Self-pay | Admitting: Obstetrics and Gynecology

## 2022-02-06 DIAGNOSIS — F419 Anxiety disorder, unspecified: Secondary | ICD-10-CM

## 2022-02-06 MED ORDER — LORAZEPAM 0.5 MG PO TABS: ORAL_TABLET | ORAL | 0 refills | Status: DC

## 2022-02-06 NOTE — Progress Notes (Signed)
Rx RF lorazepam.

## 2022-07-18 HISTORY — PX: OTHER SURGICAL HISTORY: SHX169

## 2022-07-23 ENCOUNTER — Encounter: Payer: Self-pay | Admitting: Obstetrics and Gynecology

## 2022-08-15 NOTE — Progress Notes (Unsigned)
PCP:  Chad Cordial, PA-C   No chief complaint on file.    HPI:      [name] is a 47 y.o. No obstetric history on file. who LMP was Patient's last menstrual period was 08/26/2017., presents today for her annual examination.  Her menses are absent due to total lap hyst with bilat salpingectomy due to large leio 3/19 with Dr. Kenton Kingfisher. She had a few episodes of light bleeding a couple yrs ago that resolved; none since. No vag dryness/pain. Had RT ovarian torsion with RT oophorectomy 1/24 at [facility]***  Sex activity: has sex with females. No vag pain. Last Pap: 07/18/17  Results were: no abnormalities /neg HPV DNA ; no longer indicated Hx of STDs: none  Last mammogram: 12/22/20 Results were normal, repeat in 12 months. 2019 with  Cat 3 due to stable LT breast mass. Pt had breast bx 5/19 so she didn't need to continue u/s f/u; bx showed fibroadenoma. Pt had cat 2 mammo 11/19 LT breast.  Pt wants to wait till age 78 for screening mammos.    There is no FH of breast cancer. There is no FH of ovarian cancer. There is a FH of pancreatic cancer in her MGM and melanoma in her mom and MGM, genetic testing not done and pt doesn't meet current guidelines. Both are deceased. The patient does not do self-breast exams.   Tobacco use: The patient denies current or previous tobacco use. Alcohol use: none No drug use.  Exercise: mod active  Colonoscopy: 2018 with Dr. Allen Norris; neg per pt. Can wait till age 28 for repeat.   She does get adequate calcium but not Vitamin D in her diet.  She takes ativan sparingly prn anxiety, last RF 8/22. Stopped wellbutrin for now.  Has PCP now and doing labs through her. PCP also managing wellbutrin Rx.   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 Pt has noticed hair loss with thin spots. Saw derm, no labs done; thought due to trauma/stress. Has FH hair thinning in female cousin. Isn't taking hair/nails supp.   Past Medical History:  Diagnosis  Date   Abnormal mammogram of left breast 09/2016   Anxiety    Bacterial vaginosis    Degenerative disc disease, cervical    no issues currently   Epstein Barr infection    GERD (gastroesophageal reflux disease)    Menometrorrhagia    Motion sickness    boats   Pelvic mass 07/2017    Past Surgical History:  Procedure Laterality Date   BREAST BIOPSY Left 11/22/2017   US guided biopsy, FIBROEPITHELIAL LESION, MOST CONSISTENT WITH FIBROADENOMA   COLONOSCOPY WITH PROPOFOL N/A 11/30/2016   Procedure: COLONOSCOPY WITH PROPOFOL;  Surgeon: Lucilla Lame, MD;  Location: [place];  Service: Endoscopy;  Laterality: N/A; repeat 10 yrs   CYSTOSCOPY N/A 09/26/2017   Procedure: CYSTOSCOPY;  Surgeon: Gae Dry, MD;  Location: [facility];  Service: Gynecology;  Laterality: N/A;   ESOPHAGOGASTRODUODENOSCOPY (EGD) WITH PROPOFOL N/A 01/10/2018   Procedure: ESOPHAGOGASTRODUODENOSCOPY (EGD) WITH biopsy;  Surgeon: Lucilla Lame, MD;  Location: [place];  Service: Endoscopy;  Laterality: N/A;   HEAD & NECK SKIN LESION EXCISIONAL BIOPSY Left 1989   Face Benign   LAPAROSCOPIC HYSTERECTOMY N/A 09/26/2017   Procedure: HYSTERECTOMY TOTAL LAPAROSCOPIC BILATERAL SALPINGECTOMY;  Surgeon: Gae Dry, MD;  Location: [facility];  Service: Gynecology;  Laterality: N/A;   WISDOM TOOTH EXTRACTION     all  4 were impacted    Family History  Problem Relation Age of Onset   Hyperlipidemia Father    Melanoma Maternal Grandmother    Pancreatic cancer Maternal Grandmother 33   Melanoma Mother 29   Breast cancer Neg Hx     Social History   Socioeconomic History   Marital status: Single    Spouse name: Not on file   Number of children: Not on file   Years of education: Not on file   Highest education level: Not on file  Occupational History   Not on file  Tobacco Use   Smoking status: Former    Packs/day: 0.50    Years: 10.00    Total pack years: 5.00    Types: Cigarettes    Quit  date: 07/02/2012    Years since quitting: 10.1   Smokeless tobacco: Never  Vaping Use   Vaping Use: Never used  Substance and Sexual Activity   Alcohol use: Yes    Alcohol/week: 2.0 standard drinks of alcohol    Types: 1 Glasses of wine, 1 Shots of liquor per week    Comment: Occasionally   Drug use: No   Sexual activity: Yes    Birth control/protection: Surgical    Comment: Hysterectomy  Other Topics Concern   Not on file  Social History Narrative   Not on file   Social Determinants of Health   Financial Resource Strain: Not on file  Food Insecurity: Not on file  Transportation Needs: Not on file  Physical Activity: Sufficiently Active (07/18/2017)   Exercise Vital Sign    Days of Exercise per Week: 4 days    Minutes of Exercise per Session: 50 min  Stress: Stress Concern Present (07/18/2017)   [place]    Feeling of Stress : To some extent  Social Connections: Moderately Integrated (07/18/2017)   Social Connection and Isolation Panel [NHANES]    Frequency of Communication with Friends and Family: Twice a week    Frequency of Social Gatherings with Friends and Family: Once a week    Attends Religious Services: Never    Marine scientist or Organizations: Yes    Attends Archivist Meetings: Never    Marital Status: Living with partner  Intimate Partner Violence: Not At Risk (07/18/2017)   Humiliation, Afraid, Rape, and Kick questionnaire    Fear of Current or Ex-Partner: No    Emotionally Abused: No    Physically Abused: No    Sexually Abused: No    No outpatient medications have been marked as taking for the 08/16/22 encounter (Appointment) with [name], [name], PA-C.     ROS:  Review of Systems  Constitutional:  Negative for fatigue, fever and unexpected weight change.  Respiratory:  Negative for cough, shortness of breath and wheezing.   Cardiovascular:  Negative for chest pain,  palpitations and leg swelling.  Gastrointestinal:  Negative for blood in stool, constipation, diarrhea, nausea and vomiting.  Endocrine: Negative for cold intolerance, heat intolerance and polyuria.  Genitourinary:  Negative for dyspareunia, dysuria, flank pain, frequency, genital sores, hematuria, menstrual problem, pelvic pain, urgency, vaginal bleeding, vaginal discharge and vaginal pain.  Musculoskeletal:  Negative for back pain, joint swelling and myalgias.  Skin:  Negative for rash.  Neurological:  Negative for dizziness, syncope, light-headedness, numbness and headaches.  Hematological:  Negative for adenopathy.  Psychiatric/Behavioral:  Positive for agitation. Negative for confusion, sleep disturbance and suicidal ideas. The patient is not  nervous/anxious.      Objective: LMP 08/26/2017    Physical Exam Constitutional:      Appearance: She is well-developed.  Genitourinary:     Vulva normal.     Genitourinary Comments: UTERUS/CX SURG REM     Right Labia: No rash, tenderness or lesions.    Left Labia: No tenderness, lesions or rash.    Vaginal cuff intact.    No vaginal discharge, erythema or tenderness.      Right Adnexa: not tender and no mass present.    Left Adnexa: not tender and no mass present.    Cervix is absent.     Uterus is absent.  Breasts:    Right: No mass, nipple discharge, skin change or tenderness.     Left: No mass, nipple discharge, skin change or tenderness.  Neck:     Thyroid: No thyromegaly.  Cardiovascular:     Rate and Rhythm: Normal rate and regular rhythm.     Heart sounds: Normal heart sounds. No murmur heard. Pulmonary:     Effort: Pulmonary effort is normal.     Breath sounds: Normal breath sounds.  Abdominal:     Palpations: Abdomen is soft.     Tenderness: There is no abdominal tenderness. There is no guarding.  Musculoskeletal:        General: Normal range of motion.     Cervical back: Normal range of motion.  Neurological:      General: No focal deficit present.     Mental Status: She is alert and oriented to person, place, and time.     Cranial Nerves: No cranial nerve deficit.  Skin:    General: Skin is warm and dry.  Psychiatric:        Mood and Affect: Mood normal.        Behavior: Behavior normal.        Thought Content: Thought content normal.        Judgment: Judgment normal.  Vitals reviewed.     Assessment/Plan: Encounter for annual routine gynecological examination  Encounter for screening mammogram for malignant neoplasm of breast; pt current on mammo. Recommend yearly.   Anxiety--will RF ativan prn.  Hair loss - Plan: TSH + free T4, Ferritin; check labs. If neg, could be stress vs genetic. Add hair/nails supp, pt sees derm.   Family history of melanoma--Pt seeing derm for mole checks.             GYN counsel breast self exam, mammography screening, adequate intake of calcium and vitamin D, diet and exercise     F/U  No follow-ups on file.  Dathan Attia B. Curtez Brallier, PA-C 08/15/2022 2:03 PM

## 2022-08-16 ENCOUNTER — Ambulatory Visit: Payer: Managed Care, Other (non HMO) | Admitting: Obstetrics and Gynecology

## 2022-08-16 ENCOUNTER — Encounter: Payer: Self-pay | Admitting: Obstetrics and Gynecology

## 2022-08-16 ENCOUNTER — Other Ambulatory Visit (HOSPITAL_COMMUNITY)
Admission: RE | Admit: 2022-08-16 | Discharge: 2022-08-16 | Disposition: A | Discharge status: Home / Self Care | Payer: Managed Care, Other (non HMO) | Source: Ambulatory Visit | Attending: Obstetrics and Gynecology | Admitting: Obstetrics and Gynecology

## 2022-08-16 VITALS — BP 120/70 | Ht 64.0 in | Wt 128.0 lb

## 2022-08-16 DIAGNOSIS — Z1151 Encounter for screening for human papillomavirus (HPV): Secondary | ICD-10-CM | POA: Diagnosis present

## 2022-08-16 DIAGNOSIS — G4709 Other insomnia: Secondary | ICD-10-CM

## 2022-08-16 DIAGNOSIS — Z1272 Encounter for screening for malignant neoplasm of vagina: Secondary | ICD-10-CM | POA: Insufficient documentation

## 2022-08-16 DIAGNOSIS — Z1231 Encounter for screening mammogram for malignant neoplasm of breast: Secondary | ICD-10-CM

## 2022-08-16 DIAGNOSIS — F419 Anxiety disorder, unspecified: Secondary | ICD-10-CM | POA: Insufficient documentation

## 2022-08-16 DIAGNOSIS — Z01419 Encounter for gynecological examination (general) (routine) without abnormal findings: Secondary | ICD-10-CM

## 2022-08-16 MED ORDER — LORAZEPAM 0.5 MG PO TABS: ORAL_TABLET | ORAL | 0 refills | Status: DC

## 2022-08-16 NOTE — Patient Instructions (Addendum)
I value your feedback and you entrusting us with your care. If you get a [facility] patient survey, I would appreciate you taking the time to let us know about your experience today. Thank you!  [facility]: [phone]      

## 2022-08-20 LAB — CYTOLOGY - PAP
Comment: NEGATIVE
Diagnosis: NEGATIVE
High risk HPV: NEGATIVE

## 2023-09-11 NOTE — Progress Notes (Unsigned)
 PCP:  Rica Records, PA-C   No chief complaint on file.    HPI:      [name] is a 48 y.o. No obstetric history on file. who LMP was Patient's last menstrual period was 08/26/2017., presents today for her annual examination.  Her menses are absent due to total lap hyst with bilat salpingectomy due to large leio 3/19 with Dr. Tiburcio Pea. No PMB.  Has occas vasomotor sx now, also trouble staying asleep. Has takes ativan for sleep occas with sx relief.  S/p RT ovarian torsion with RT oophorectomy 1/24 at [facility]; doing well with recovery. Never had to do f/u with them.   Sex activity: has female partner. No vag pain/dryness Last Pap: 08/16/22  Results were: no abnormalities /neg HPV DNA ; hx of abn pap in past.  Last mammogram: 12/22/20 Results were normal, repeat in 12 months. 2019 with  Cat 3 due to stable LT breast mass. Pt had breast bx 5/19 so she didn't need to continue u/s f/u; bx showed fibroadenoma. Pt had cat 2 mammo 11/19 LT breast.  Pt wants to wait till age 78 for screening mammos but may do this yr since has already met her deductible.   There is no FH of breast cancer. There is no FH of ovarian cancer. There is a FH of pancreatic cancer in her MGM and melanoma in her mom and MGM, genetic testing not done and pt doesn't meet current guidelines. Both are deceased. The patient does self-breast exams.   Tobacco use: The patient denies current or previous tobacco use. Alcohol use: none No drug use.  Exercise: mod active  Colonoscopy: 2018 with Dr. Servando Snare; neg per pt. Can wait till age 20 for repeat.   She does get adequate calcium but not Vitamin D in her diet.  She takes ativan sparingly prn anxiety, last RF 8/23.  Has PCP now and doing labs through her.    Pt sees derm regularly for mole checks due to FH melanoma.   Past Medical History:  Diagnosis Date   Abnormal mammogram of left breast 09/2016   Anxiety    Bacterial vaginosis    Degenerative disc disease,  cervical    no issues currently   Epstein Barr infection    GERD (gastroesophageal reflux disease)    Menometrorrhagia    Motion sickness    boats   Pelvic mass 07/2017    Past Surgical History:  Procedure Laterality Date   BREAST BIOPSY Left 11/22/2017   US guided biopsy, FIBROEPITHELIAL LESION, MOST CONSISTENT WITH FIBROADENOMA   COLONOSCOPY WITH PROPOFOL N/A 11/30/2016   Procedure: COLONOSCOPY WITH PROPOFOL;  Surgeon: Midge Minium, MD;  Location: [facility];  Service: Endoscopy;  Laterality: N/A; repeat 10 yrs   CYSTOSCOPY N/A 09/26/2017   Procedure: CYSTOSCOPY;  Surgeon: Nadara Mustard, MD;  Location: [facility];  Service: Gynecology;  Laterality: N/A;   ESOPHAGOGASTRODUODENOSCOPY (EGD) WITH PROPOFOL N/A 01/10/2018   Procedure: ESOPHAGOGASTRODUODENOSCOPY (EGD) WITH biopsy;  Surgeon: Midge Minium, MD;  Location: [facility];  Service: Endoscopy;  Laterality: N/A;   HEAD & NECK SKIN LESION EXCISIONAL BIOPSY Left 1989   Face Benign   LAPAROSCOPIC HYSTERECTOMY N/A 09/26/2017   Procedure: HYSTERECTOMY TOTAL LAPAROSCOPIC BILATERAL SALPINGECTOMY;  Surgeon: Nadara Mustard, MD;  Location: [facility];  Service: Gynecology;  Laterality: N/A;   right ovary removal Right 07/18/2022   WISDOM TOOTH EXTRACTION     all 4 were impacted    Family History  Problem Relation Age of Onset   Hyperlipidemia Father    Melanoma Maternal Grandmother    Pancreatic cancer Maternal Grandmother 60   Melanoma Mother 32   Breast cancer Neg Hx     Social History   Socioeconomic History   Marital status: Single    Spouse name: Not on file   Number of children: Not on file   Years of education: Not on file   Highest education level: Not on file  Occupational History   Not on file  Tobacco Use   Smoking status: Former    Current packs/day: 0.00    Average packs/day: 0.5 packs/day for 10.0 years (5.0 ttl pk-yrs)    Types: Cigarettes    Start date: 07/02/2002    Quit date: 07/02/2012     Years since quitting: 11.2   Smokeless tobacco: Never  Vaping Use   Vaping status: Never Used  Substance and Sexual Activity   Alcohol use: Yes    Alcohol/week: 2.0 standard drinks of alcohol    Types: 1 Glasses of wine, 1 Shots of liquor per week    Comment: Occasionally   Drug use: No   Sexual activity: Yes    Birth control/protection: Surgical    Comment: Hysterectomy  Other Topics Concern   Not on file  Social History Narrative   Not on file   Social Drivers of Health   Financial Resource Strain: Low Risk  (10/17/2022)   Received from [facility], [facility]   Overall Financial Resource Strain (CARDIA)    Difficulty of Paying Living Expenses: Not hard at all  Food Insecurity: No Food Insecurity (10/17/2022)   Received from [facility], [facility]   Hunger Vital Sign    Worried About Running Out of Food in the Last Year: Never true    Ran Out of Food in the Last Year: Never true  Transportation Needs: No Transportation Needs (10/17/2022)   Received from [facility], [facility]   [facility] - Transportation    In the past 12 months, has lack of transportation kept you from medical appointments or from getting medications?: No    Lack of Transportation (Non-Medical): No  Physical Activity: Sufficiently Active (10/13/2020)   Received from [facility], [facility]   Exercise Vital Sign    Days of Exercise per Week: 4 days    Minutes of Exercise per Session: 60 min  Stress: Patient Declined (10/13/2020)   Received from [facility], [facility]   [employer] of Occupational Health - Occupational Stress Questionnaire    Feeling of Stress : Patient declined  Social Connections: Moderately Isolated (10/13/2020)   Received from [facility], [facility]   Social  Connection and Isolation Panel [NHANES]    Frequency of Communication with Friends and Family: More than three times a week    Frequency of Social Gatherings with Friends and Family: Once a week    Attends Religious Services: Never    Database administrator or Organizations: No    Attends Banker Meetings: Never    Marital Status: Living with partner  Intimate Partner Violence: Not At Risk (07/18/2017)   Humiliation, Afraid, Rape, and Kick questionnaire    Fear of Current or Ex-Partner: No    Emotionally Abused: No    Physically Abused: No    Sexually Abused: No  No outpatient medications have been marked as taking for the 09/12/23 encounter (Appointment) with Alexi Dorminey, Ilona Sorrel, PA-C.     ROS:  Review of Systems  Constitutional:  Negative for fatigue, fever and unexpected weight change.  Respiratory:  Negative for cough, shortness of breath and wheezing.   Cardiovascular:  Negative for chest pain, palpitations and leg swelling.  Gastrointestinal:  Negative for blood in stool, constipation, diarrhea, nausea and vomiting.  Endocrine: Negative for cold intolerance, heat intolerance and polyuria.  Genitourinary:  Negative for dyspareunia, dysuria, flank pain, frequency, genital sores, hematuria, menstrual problem, pelvic pain, urgency, vaginal bleeding, vaginal discharge and vaginal pain.  Musculoskeletal:  Positive for arthralgias. Negative for back pain, joint swelling and myalgias.  Skin:  Negative for rash.  Neurological:  Negative for dizziness, syncope, light-headedness, numbness and headaches.  Hematological:  Negative for adenopathy.  Psychiatric/Behavioral:  Positive for agitation and sleep disturbance. Negative for confusion and suicidal ideas. The patient is not nervous/anxious.      Objective: LMP 08/26/2017    Physical Exam Constitutional:      Appearance: She is well-developed.  Genitourinary:     Vulva normal.     Genitourinary Comments: UTERUS/CX  SURG REM     Right Labia: No rash, tenderness or lesions.    Left Labia: No tenderness, lesions or rash.    Vaginal cuff intact.    No vaginal discharge, erythema or tenderness.      Right Adnexa: not tender and no mass present.    Left Adnexa: not tender and no mass present.    Cervix is absent.     Uterus is absent.  Breasts:    Right: No mass, nipple discharge, skin change or tenderness.     Left: No mass, nipple discharge, skin change or tenderness.  Neck:     Thyroid: No thyromegaly.  Cardiovascular:     Rate and Rhythm: Normal rate and regular rhythm.     Heart sounds: Normal heart sounds. No murmur heard. Pulmonary:     Effort: Pulmonary effort is normal.     Breath sounds: Normal breath sounds.  Abdominal:     Palpations: Abdomen is soft.     Tenderness: There is no abdominal tenderness. There is no guarding.  Musculoskeletal:        General: Normal range of motion.     Cervical back: Normal range of motion.  Neurological:     General: No focal deficit present.     Mental Status: She is alert and oriented to person, place, and time.     Cranial Nerves: No cranial nerve deficit.  Skin:    General: Skin is warm and dry.  Psychiatric:        Mood and Affect: Mood normal.        Behavior: Behavior normal.        Thought Content: Thought content normal.        Judgment: Judgment normal.  Vitals reviewed.     Assessment/Plan: Encounter for annual routine gynecological examination  Screening for vaginal cancer - Plan: Cytology - PAP  Screening for HPV (human papillomavirus) - Plan: Cytology - PAP  Encounter for screening mammogram for malignant neoplasm of breast - Plan: MM 3D SCREEN BREAST BILATERAL; pt to schedule mammo  Anxiety - Plan: LORazepam (ATIVAN) 0.5 MG tablet; Rx RF. Takes sparingly.  Other insomnia--try melatonin/unisom. Don't take benzos for sleep. F/u prn  Family history of melanoma--Pt seeing derm for mole checks.  No orders of the  defined types were placed in this encounter.   GYN counsel breast self exam, mammography screening, adequate intake of calcium and vitamin D, diet and exercise     F/U  No follow-ups on file.  Omelia Marquart B. Meilah Delrosario, PA-C 09/11/2023 3:39 PM

## 2023-09-12 ENCOUNTER — Encounter: Payer: Self-pay | Admitting: Obstetrics and Gynecology

## 2023-09-12 ENCOUNTER — Ambulatory Visit: Payer: Managed Care, Other (non HMO) | Admitting: Obstetrics and Gynecology

## 2023-09-12 VITALS — BP 95/60 | HR 81 | Ht 64.0 in | Wt 125.0 lb

## 2023-09-12 DIAGNOSIS — G4709 Other insomnia: Secondary | ICD-10-CM

## 2023-09-12 DIAGNOSIS — Z1231 Encounter for screening mammogram for malignant neoplasm of breast: Secondary | ICD-10-CM

## 2023-09-12 DIAGNOSIS — Z01419 Encounter for gynecological examination (general) (routine) without abnormal findings: Secondary | ICD-10-CM

## 2023-09-12 DIAGNOSIS — F419 Anxiety disorder, unspecified: Secondary | ICD-10-CM

## 2023-09-12 MED ORDER — LORAZEPAM 0.5 MG PO TABS
ORAL_TABLET | ORAL | 0 refills | Status: AC
Start: 2023-09-12 — End: ?

## 2023-09-12 NOTE — Patient Instructions (Addendum)
 I value your feedback and you entrusting [place] with your care. If you get a [facility] patient survey, I would appreciate you taking the time to let us know about your experience today. Thank you!  [facility] ([place])--[phone]
